# Patient Record
Sex: Male | Born: 1955 | Race: White | Hispanic: No | Marital: Married | Smoking: Former smoker
Health system: Southern US, Community
[De-identification: ages and names within clinical notes are randomized; demographics above are authoritative.]

## PROBLEM LIST (undated history)

## (undated) DIAGNOSIS — L409 Psoriasis, unspecified: Secondary | ICD-10-CM

## (undated) DIAGNOSIS — I10 Essential (primary) hypertension: Secondary | ICD-10-CM

## (undated) DIAGNOSIS — T7840XA Allergy, unspecified, initial encounter: Secondary | ICD-10-CM

## (undated) DIAGNOSIS — E669 Obesity, unspecified: Secondary | ICD-10-CM

## (undated) DIAGNOSIS — F419 Anxiety disorder, unspecified: Secondary | ICD-10-CM

## (undated) DIAGNOSIS — E079 Disorder of thyroid, unspecified: Secondary | ICD-10-CM

## (undated) DIAGNOSIS — C801 Malignant (primary) neoplasm, unspecified: Secondary | ICD-10-CM

## (undated) DIAGNOSIS — C61 Malignant neoplasm of prostate: Secondary | ICD-10-CM

## (undated) DIAGNOSIS — E785 Hyperlipidemia, unspecified: Secondary | ICD-10-CM

## (undated) DIAGNOSIS — K5790 Diverticulosis of intestine, part unspecified, without perforation or abscess without bleeding: Secondary | ICD-10-CM

## (undated) DIAGNOSIS — E039 Hypothyroidism, unspecified: Secondary | ICD-10-CM

## (undated) DIAGNOSIS — K5090 Crohn's disease, unspecified, without complications: Secondary | ICD-10-CM

## (undated) DIAGNOSIS — M1990 Unspecified osteoarthritis, unspecified site: Secondary | ICD-10-CM

## (undated) DIAGNOSIS — K219 Gastro-esophageal reflux disease without esophagitis: Secondary | ICD-10-CM

## (undated) DIAGNOSIS — D126 Benign neoplasm of colon, unspecified: Secondary | ICD-10-CM

## (undated) HISTORY — DX: Diverticulosis of intestine, part unspecified, without perforation or abscess without bleeding: K57.90

## (undated) HISTORY — DX: Hypothyroidism, unspecified: E03.9

## (undated) HISTORY — DX: Essential (primary) hypertension: I10

## (undated) HISTORY — PX: ANAL FISTULECTOMY: SHX1139

## (undated) HISTORY — DX: Anxiety disorder, unspecified: F41.9

## (undated) HISTORY — PX: POLYPECTOMY: SHX149

## (undated) HISTORY — DX: Malignant neoplasm of prostate: C61

## (undated) HISTORY — DX: Malignant (primary) neoplasm, unspecified: C80.1

## (undated) HISTORY — PX: WISDOM TOOTH EXTRACTION: SHX21

## (undated) HISTORY — DX: Hyperlipidemia, unspecified: E78.5

## (undated) HISTORY — DX: Disorder of thyroid, unspecified: E07.9

## (undated) HISTORY — DX: Obesity, unspecified: E66.9

## (undated) HISTORY — DX: Unspecified osteoarthritis, unspecified site: M19.90

## (undated) HISTORY — PX: COLONOSCOPY: SHX174

## (undated) HISTORY — PX: TONSILLECTOMY: SUR1361

## (undated) HISTORY — DX: Benign neoplasm of colon, unspecified: D12.6

## (undated) HISTORY — DX: Gastro-esophageal reflux disease without esophagitis: K21.9

## (undated) HISTORY — DX: Crohn's disease, unspecified, without complications: K50.90

## (undated) HISTORY — DX: Allergy, unspecified, initial encounter: T78.40XA

## (undated) HISTORY — DX: Psoriasis, unspecified: L40.9

---

## 2009-08-29 ENCOUNTER — Ambulatory Visit: Payer: Self-pay | Admitting: Gastroenterology

## 2009-09-09 ENCOUNTER — Telehealth: Payer: Self-pay | Admitting: Gastroenterology

## 2009-09-13 ENCOUNTER — Ambulatory Visit: Payer: Self-pay | Admitting: Gastroenterology

## 2009-09-24 ENCOUNTER — Encounter: Payer: Self-pay | Admitting: Gastroenterology

## 2012-10-26 HISTORY — PX: PROSTATE CRYOABLATION: SUR358

## 2014-02-26 ENCOUNTER — Encounter: Payer: Self-pay | Admitting: Sports Medicine

## 2014-02-26 ENCOUNTER — Ambulatory Visit (INDEPENDENT_AMBULATORY_CARE_PROVIDER_SITE_OTHER): Payer: 59 | Admitting: Sports Medicine

## 2014-02-26 VITALS — BP 142/93 | Ht 73.0 in | Wt 292.0 lb

## 2014-02-26 DIAGNOSIS — M7660 Achilles tendinitis, unspecified leg: Secondary | ICD-10-CM

## 2014-02-26 DIAGNOSIS — M79671 Pain in right foot: Secondary | ICD-10-CM

## 2014-02-26 DIAGNOSIS — M7661 Achilles tendinitis, right leg: Secondary | ICD-10-CM

## 2014-02-26 DIAGNOSIS — M79609 Pain in unspecified limb: Secondary | ICD-10-CM

## 2014-02-26 NOTE — Progress Notes (Signed)
   Subjective:    Patient ID: George Mathews, male    DOB: August 01, 1956, 58 y.o.   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 No injury that he can recall but gradual onset of pain and swelling that he notices mainly at the end of activity. He also describes some significant pain with standing and walking after prolonged sitting. He localizes all of his discomfort to the posterior heel. He has had a history of plantar fasciitis in the left foot previously but states his current pain is different in nature than what he experienced at that time. He denies any prior surgeries to his feet or ankles in the past. He has not had any specific treatment for this condition. He is referred by his primary care physician, Dr. Brigitte Pulse, for evaluation and treatment.  Past medical history and surgical history are reviewed. History is significant for recent surgery for prostate cancer. He also has a history of hypothyroidism, hypercholesterolemia, and hypertension. Medications reviewed. These include Crestor, Synthroid, and Viagra No known drug allergies Socially he does not smoke, drinks alcohol on occasion, and works as a Airline pilot    Review of Systems As above    Objective:   Physical Exam Well-developed, overweight. No acute distress. Awake alert and oriented x3. Vital signs reviewed.  Right heel: There is tenderness as well as induration at the insertion of the Achilles tendon onto the calcaneus. No significant soft tissue swelling. No thickening of the tendon proximal to the calcaneus. Fairly good passive dorsiflexion without pain. No tenderness to palpation at the calcaneal insertion of the plantar fascia. Neurovascularly intact distally. Walking with a very slight limp.  MSK ultrasound of the right Achilles was performed. Images in both long and short axis were obtained. He  has hypoechoic changes as well as calcification of the distal Achilles tendon at the insertion onto the calcaneus. There is thickening of the tendon at this level as well. 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 Eccentric heel drops exercises to be done daily. We discussed the possibility of topical nitroglycerin but the patient is currently taking Viagra each bedtime as part of his rehabilitation from his recent prostate surgery. This is a contraindication to nitroglycerin so we will not start that at this time. However, that is something we may reconsider down the road. I've asked him to start icing his heel at the end of activity. Patient will followup with me in 4 weeks. He understands that this is a long process and it may take several months for his symptoms to resolve.  Total time spent with the patient was 40 minutes with greater than 50% of that time spent in face-to-face consultation.

## 2014-03-26 ENCOUNTER — Ambulatory Visit: Payer: 59 | Admitting: Sports Medicine

## 2014-04-10 ENCOUNTER — Ambulatory Visit: Payer: 59 | Admitting: Sports Medicine

## 2014-07-04 ENCOUNTER — Encounter: Payer: Self-pay | Admitting: Gastroenterology

## 2015-01-28 ENCOUNTER — Encounter: Payer: Self-pay | Admitting: Gastroenterology

## 2015-05-30 ENCOUNTER — Other Ambulatory Visit: Payer: Self-pay | Admitting: Occupational Medicine

## 2015-05-30 ENCOUNTER — Ambulatory Visit
Admission: RE | Admit: 2015-05-30 | Discharge: 2015-05-30 | Disposition: A | Discharge status: Home / Self Care | Payer: No Typology Code available for payment source | Source: Ambulatory Visit | Attending: Occupational Medicine | Admitting: Occupational Medicine

## 2015-05-30 DIAGNOSIS — Z139 Encounter for screening, unspecified: Secondary | ICD-10-CM

## 2015-07-22 ENCOUNTER — Encounter: Payer: Self-pay | Admitting: Gastroenterology

## 2015-11-21 ENCOUNTER — Ambulatory Visit (AMBULATORY_SURGERY_CENTER): Payer: Self-pay | Admitting: *Deleted

## 2015-11-21 VITALS — Ht 74.0 in | Wt 313.0 lb

## 2015-11-21 DIAGNOSIS — Z86010 Personal history of colonic polyps: Secondary | ICD-10-CM

## 2015-11-21 DIAGNOSIS — K5000 Crohn's disease of small intestine without complications: Secondary | ICD-10-CM

## 2015-11-21 MED ORDER — NA SULFATE-K SULFATE-MG SULF 17.5-3.13-1.6 GM/177ML PO SOLN: 1.0000 | Freq: Once | ORAL | Status: DC

## 2015-11-21 NOTE — Progress Notes (Signed)
No egg or soy allergy known to patient  No issues with past sedation with any surgeries  or procedures, no intubation problems  No diet pills per patient No home 02 use per patient  No blood thinners per patient  emmi declined Pt and wife in [place] today. All instructions discussed with both, questions answered.

## 2015-12-02 ENCOUNTER — Encounter: Payer: Self-pay | Admitting: Gastroenterology

## 2015-12-02 ENCOUNTER — Ambulatory Visit (AMBULATORY_SURGERY_CENTER): Payer: Commercial Managed Care - PPO | Admitting: Gastroenterology

## 2015-12-02 VITALS — BP 120/52 | HR 66 | Temp 96.2°F | Resp 10 | Ht 74.0 in | Wt 313.0 lb

## 2015-12-02 DIAGNOSIS — D120 Benign neoplasm of cecum: Secondary | ICD-10-CM

## 2015-12-02 DIAGNOSIS — D124 Benign neoplasm of descending colon: Secondary | ICD-10-CM

## 2015-12-02 DIAGNOSIS — Z86010 Personal history of colonic polyps: Secondary | ICD-10-CM

## 2015-12-02 MED ORDER — SODIUM CHLORIDE 0.9 % IV SOLN: 500.0000 mL | INTRAVENOUS | Status: DC

## 2015-12-02 NOTE — Progress Notes (Signed)
Called to room to assist during endoscopic procedure.  Patient ID and intended procedure confirmed with present staff. Received instructions for my participation in the procedure from the performing physician.  

## 2015-12-02 NOTE — Progress Notes (Signed)
To recovery, report to [facility], [facility]

## 2015-12-02 NOTE — Patient Instructions (Signed)
YOU HAD AN ENDOSCOPIC PROCEDURE TODAY AT [facility]:   Refer to the procedure report that was given to you for any specific questions about what was found during the examination.  If the procedure report does not answer your questions, please call your gastroenterologist to clarify.  If you requested that your care partner not be given the details of your procedure findings, then the procedure report has been included in a sealed envelope for you to review at your convenience later.  YOU SHOULD EXPECT: Some feelings of bloating in the abdomen. Passage of more gas than usual.  Walking can help get rid of the air that was put into your GI tract during the procedure and reduce the bloating. If you had a lower endoscopy (such as a colonoscopy or flexible sigmoidoscopy) you may notice spotting of blood in your stool or on the toilet paper. If you underwent a bowel prep for your procedure, you may not have a normal bowel movement for a few days.  Please Note:  You might notice some irritation and congestion in your nose or some drainage.  This is from the oxygen used during your procedure.  There is no need for concern and it should clear up in a day or so.  SYMPTOMS TO REPORT IMMEDIATELY:   Following lower endoscopy (colonoscopy or flexible sigmoidoscopy):  Excessive amounts of blood in the stool  Significant tenderness or worsening of abdominal pains  Swelling of the abdomen that is new, acute  Fever of 100F or higher   For urgent or emergent issues, a gastroenterologist can be reached at any hour by calling [phone].   DIET: Your first meal following the procedure should be a small meal and then it is ok to progress to your normal diet. Heavy or fried foods are harder to digest and may make you feel nauseous or bloated.  Likewise, meals heavy in dairy and vegetables can increase bloating.  Drink plenty of fluids but you should avoid alcoholic beverages for 24  hours.  ACTIVITY:  You should plan to take it easy for the rest of today and you should NOT DRIVE or use heavy machinery until tomorrow (because of the sedation medicines used during the test).    FOLLOW UP: Our staff will call the number listed on your records the next business day following your procedure to check on you and address any questions or concerns that you may have regarding the information given to you following your procedure. If we do not reach you, we will leave a message.  However, if you are feeling well and you are not experiencing any problems, there is no need to return our call.  We will assume that you have returned to your regular daily activities without incident.  If any biopsies were taken you will be contacted by phone or by letter within the next 1-3 weeks.  Please call us at [phone] if you have not heard about the biopsies in 3 weeks.    SIGNATURES/CONFIDENTIALITY: You and/or your care partner have signed paperwork which will be entered into your electronic medical record.  These signatures attest to the fact that that the information above on your After Visit Summary has been reviewed and is understood.  Full responsibility of the confidentiality of this discharge information lies with you and/or your care-partner.  Polyps, diverticulosis, high fiber diet, hemorrhoids-handouts given  Repeat colonoscopy in 5 years 2022.

## 2015-12-02 NOTE — Op Note (Signed)
[place]  [address] [place], [id number]   COLONOSCOPY PROCEDURE REPORT PATIENT: Adair, Stotler  MR#: [id number] BIRTHDATE: 1956-10-23 , 59  yrs. old GENDER: male ENDOSCOPIST: Ladene Artist, MD, [facility] REFERRED [id number] Brigitte Pulse, M.D. PROCEDURE DATE:  12/02/2015 PROCEDURE:   Colonoscopy, surveillance , Colonoscopy with biopsy, and Colonoscopy with snare polypectomy First Screening Colonoscopy - Avg.  risk and is 50 yrs.  old or older - No.  Prior Negative Screening - Now for repeat screening. N/A  History of Adenoma - Now for follow-up colonoscopy & has been > or = to 3 yrs.  Yes hx of adenoma.  Has been 3 or more years since last colonoscopy.  Polyps removed today? Yes ASA CLASS:   Class II INDICATIONS:Surveillance due to prior colonic neoplasia and PH Colon Adenoma. MEDICATIONS: Monitored anesthesia care and Propofol 250 mg IV DESCRIPTION OF PROCEDURE:   After the risks benefits and alternatives of the procedure were thoroughly explained, informed consent was obtained.  The digital rectal exam revealed no abnormalities of the rectum.   The LB [id number]  endoscope was introduced through the anus and advanced to the cecum, which was identified by both the appendix and ileocecal valve. No adverse events experienced.   The quality of the prep was good.  (MiraLax was used)  The instrument was then slowly withdrawn as the colon was fully examined. Estimated blood loss is zero unless otherwise noted in this procedure report.  COLON FINDINGS: A sessile polyp measuring 5 mm in size was found at the cecum.  A polypectomy was performed with cold forceps.  The resection was complete, the polyp tissue was completely retrieved and sent to histology.   A sessile polyp measuring 6 mm in size was found in the descending colon.  A polypectomy was performed with a cold snare.  The resection was complete, the polyp tissue was completely retrieved and sent to  histology. There was moderate diverticulosis noted in the sigmoid colon and descending colon with associated colonic spasm, colonic narrowing and muscular hypertrophy. The examination was otherwise normal.  Retroflexed views revealed internal Grade I hemorrhoids. The time to cecum = 3.1 Withdrawal time = 11.0   The scope was withdrawn and the procedure completed. COMPLICATIONS: There were no immediate complications.  ENDOSCOPIC IMPRESSION: 1.   Sessile polyp at the cecum; polypectomy performed with cold forceps 2.   Sessile polyp in the descending colon; polypectomy performed with a cold snare 3.   Moderate diverticulosis in the sigmoid colon and descending colon 4.   Grade l internal hemorrhoids  RECOMMENDATIONS: 1.  Await pathology results 2.  High fiber diet with liberal fluid intake. 3.  Repeat Colonoscopy in 5 years.  eSigned:  Ladene Artist, MD, [facility] 12/02/2015 9:26 AM

## 2015-12-02 NOTE — Progress Notes (Signed)
No egg or soy allergy known to patient  No issues with past sedation with any surgeries  or procedures, no intubation problems  No diet pills per patient No home 02 use per patient  No blood thinners per patient    

## 2015-12-03 ENCOUNTER — Telehealth: Payer: Self-pay | Admitting: *Deleted

## 2015-12-03 NOTE — Telephone Encounter (Signed)
  Follow up Call-  Call back number 12/02/2015  Post procedure Call Back phone  # Pamala Hurry  Permission to leave phone message Yes     Patient questions:  Do you have a fever, pain , or abdominal swelling? No. Pain Score  0 *  Have you tolerated food without any problems? Yes.    Have you been able to return to your normal activities? Yes.    Do you have any questions about your discharge instructions: Diet   No. Medications  No. Follow up visit  No.  Do you have questions or concerns about your Care? No.  Actions: * If pain score is 4 or above: No action needed, pain <4.

## 2015-12-03 NOTE — Telephone Encounter (Deleted)
  Follow up Call-  Call back number 12/02/2015  Post procedure Call Back phone  # Pamala Hurry  Permission to leave phone message Yes     Patient questions:  Do you have a fever, pain , or abdominal swelling? {yes no:314532} Pain Score  {NUMBERS; 0-10:5044} *  Have you tolerated food without any problems? {yes no:314532}  Have you been able to return to your normal activities? {yes no:314532}  Do you have any questions about your discharge instructions: Diet   {yes no:314532} Medications  {yes no:314532} Follow up visit  {yes no:314532}  Do you have questions or concerns about your Care? {yes no:314532}  Actions: * If pain score is 4 or above: {ACTION; [facility] ENDO PAIN >4:21563::"No action needed, pain <4."}

## 2015-12-10 ENCOUNTER — Encounter: Payer: Self-pay | Admitting: Gastroenterology

## 2016-12-30 DIAGNOSIS — E039 Hypothyroidism, unspecified: Secondary | ICD-10-CM | POA: Diagnosis not present

## 2016-12-30 DIAGNOSIS — R7301 Impaired fasting glucose: Secondary | ICD-10-CM | POA: Diagnosis not present

## 2016-12-30 DIAGNOSIS — Z0000 Encounter for general adult medical examination without abnormal findings: Secondary | ICD-10-CM | POA: Diagnosis not present

## 2016-12-30 DIAGNOSIS — I10 Essential (primary) hypertension: Secondary | ICD-10-CM | POA: Diagnosis not present

## 2016-12-30 DIAGNOSIS — C61 Malignant neoplasm of prostate: Secondary | ICD-10-CM | POA: Diagnosis not present

## 2017-01-13 DIAGNOSIS — M25561 Pain in right knee: Secondary | ICD-10-CM | POA: Diagnosis not present

## 2017-01-13 DIAGNOSIS — M545 Low back pain: Secondary | ICD-10-CM | POA: Diagnosis not present

## 2017-01-13 DIAGNOSIS — M722 Plantar fascial fibromatosis: Secondary | ICD-10-CM | POA: Diagnosis not present

## 2017-01-15 DIAGNOSIS — M25561 Pain in right knee: Secondary | ICD-10-CM | POA: Diagnosis not present

## 2017-01-15 DIAGNOSIS — M722 Plantar fascial fibromatosis: Secondary | ICD-10-CM | POA: Diagnosis not present

## 2017-01-15 DIAGNOSIS — M545 Low back pain: Secondary | ICD-10-CM | POA: Diagnosis not present

## 2017-01-20 DIAGNOSIS — M545 Low back pain: Secondary | ICD-10-CM | POA: Diagnosis not present

## 2017-01-20 DIAGNOSIS — M722 Plantar fascial fibromatosis: Secondary | ICD-10-CM | POA: Diagnosis not present

## 2017-01-20 DIAGNOSIS — M25561 Pain in right knee: Secondary | ICD-10-CM | POA: Diagnosis not present

## 2017-01-26 DIAGNOSIS — M545 Low back pain: Secondary | ICD-10-CM | POA: Diagnosis not present

## 2017-01-26 DIAGNOSIS — M722 Plantar fascial fibromatosis: Secondary | ICD-10-CM | POA: Diagnosis not present

## 2017-01-26 DIAGNOSIS — M25561 Pain in right knee: Secondary | ICD-10-CM | POA: Diagnosis not present

## 2017-02-01 DIAGNOSIS — C61 Malignant neoplasm of prostate: Secondary | ICD-10-CM | POA: Diagnosis not present

## 2017-02-04 DIAGNOSIS — M545 Low back pain: Secondary | ICD-10-CM | POA: Diagnosis not present

## 2017-02-04 DIAGNOSIS — M25561 Pain in right knee: Secondary | ICD-10-CM | POA: Diagnosis not present

## 2017-02-04 DIAGNOSIS — M722 Plantar fascial fibromatosis: Secondary | ICD-10-CM | POA: Diagnosis not present

## 2017-02-09 DIAGNOSIS — M25561 Pain in right knee: Secondary | ICD-10-CM | POA: Diagnosis not present

## 2017-02-09 DIAGNOSIS — M545 Low back pain: Secondary | ICD-10-CM | POA: Diagnosis not present

## 2017-02-09 DIAGNOSIS — M722 Plantar fascial fibromatosis: Secondary | ICD-10-CM | POA: Diagnosis not present

## 2017-02-18 DIAGNOSIS — M545 Low back pain: Secondary | ICD-10-CM | POA: Diagnosis not present

## 2017-02-18 DIAGNOSIS — M25561 Pain in right knee: Secondary | ICD-10-CM | POA: Diagnosis not present

## 2017-02-18 DIAGNOSIS — M722 Plantar fascial fibromatosis: Secondary | ICD-10-CM | POA: Diagnosis not present

## 2017-02-24 DIAGNOSIS — M25561 Pain in right knee: Secondary | ICD-10-CM | POA: Diagnosis not present

## 2017-02-24 DIAGNOSIS — M545 Low back pain: Secondary | ICD-10-CM | POA: Diagnosis not present

## 2017-02-24 DIAGNOSIS — M722 Plantar fascial fibromatosis: Secondary | ICD-10-CM | POA: Diagnosis not present

## 2017-08-07 DIAGNOSIS — L255 Unspecified contact dermatitis due to plants, except food: Secondary | ICD-10-CM | POA: Diagnosis not present

## 2018-01-04 DIAGNOSIS — C61 Malignant neoplasm of prostate: Secondary | ICD-10-CM | POA: Diagnosis not present

## 2018-01-04 DIAGNOSIS — I10 Essential (primary) hypertension: Secondary | ICD-10-CM | POA: Diagnosis not present

## 2018-01-04 DIAGNOSIS — R7301 Impaired fasting glucose: Secondary | ICD-10-CM | POA: Diagnosis not present

## 2018-01-04 DIAGNOSIS — E039 Hypothyroidism, unspecified: Secondary | ICD-10-CM | POA: Diagnosis not present

## 2018-01-04 DIAGNOSIS — E7800 Pure hypercholesterolemia, unspecified: Secondary | ICD-10-CM | POA: Diagnosis not present

## 2018-01-04 DIAGNOSIS — Z0000 Encounter for general adult medical examination without abnormal findings: Secondary | ICD-10-CM | POA: Diagnosis not present

## 2018-04-08 DIAGNOSIS — R5383 Other fatigue: Secondary | ICD-10-CM | POA: Diagnosis not present

## 2018-04-12 DIAGNOSIS — E291 Testicular hypofunction: Secondary | ICD-10-CM | POA: Diagnosis not present

## 2018-05-10 DIAGNOSIS — R1032 Left lower quadrant pain: Secondary | ICD-10-CM | POA: Diagnosis not present

## 2018-05-10 DIAGNOSIS — A084 Viral intestinal infection, unspecified: Secondary | ICD-10-CM | POA: Diagnosis not present

## 2018-05-13 ENCOUNTER — Encounter (HOSPITAL_COMMUNITY): Payer: Self-pay

## 2018-05-13 ENCOUNTER — Emergency Department (HOSPITAL_COMMUNITY)
Admission: EM | Admit: 2018-05-13 | Discharge: 2018-05-13 | Disposition: A | Discharge status: Home / Self Care | Payer: Commercial Managed Care - PPO | Attending: Emergency Medicine | Admitting: Emergency Medicine

## 2018-05-13 ENCOUNTER — Emergency Department (HOSPITAL_COMMUNITY): Payer: Commercial Managed Care - PPO

## 2018-05-13 ENCOUNTER — Other Ambulatory Visit: Payer: Self-pay

## 2018-05-13 DIAGNOSIS — I10 Essential (primary) hypertension: Secondary | ICD-10-CM | POA: Diagnosis not present

## 2018-05-13 DIAGNOSIS — N200 Calculus of kidney: Secondary | ICD-10-CM | POA: Diagnosis not present

## 2018-05-13 DIAGNOSIS — Z87891 Personal history of nicotine dependence: Secondary | ICD-10-CM | POA: Insufficient documentation

## 2018-05-13 DIAGNOSIS — E079 Disorder of thyroid, unspecified: Secondary | ICD-10-CM | POA: Insufficient documentation

## 2018-05-13 DIAGNOSIS — Z79899 Other long term (current) drug therapy: Secondary | ICD-10-CM | POA: Diagnosis not present

## 2018-05-13 DIAGNOSIS — Z7982 Long term (current) use of aspirin: Secondary | ICD-10-CM | POA: Insufficient documentation

## 2018-05-13 DIAGNOSIS — R109 Unspecified abdominal pain: Secondary | ICD-10-CM | POA: Diagnosis not present

## 2018-05-13 LAB — CBC WITH DIFFERENTIAL/PLATELET
BASOS ABS: 0 10*3/uL (ref 0.0–0.1)
BASOS PCT: 0 %
EOS PCT: 2 %
Eosinophils Absolute: 0.2 10*3/uL (ref 0.0–0.7)
HEMATOCRIT: 41.7 % (ref 39.0–52.0)
Hemoglobin: 14.3 g/dL (ref 13.0–17.0)
LYMPHS PCT: 10 %
Lymphs Abs: 0.9 10*3/uL (ref 0.7–4.0)
MCH: 30.6 pg (ref 26.0–34.0)
MCHC: 34.3 g/dL (ref 30.0–36.0)
MCV: 89.3 fL (ref 78.0–100.0)
Monocytes Absolute: 0.8 10*3/uL (ref 0.1–1.0)
Monocytes Relative: 8 %
Neutro Abs: 7.8 10*3/uL — ABNORMAL HIGH (ref 1.7–7.7)
Neutrophils Relative %: 80 %
PLATELETS: 233 10*3/uL (ref 150–400)
RBC: 4.67 MIL/uL (ref 4.22–5.81)
RDW: 13.1 % (ref 11.5–15.5)
WBC: 9.8 10*3/uL (ref 4.0–10.5)

## 2018-05-13 LAB — URINALYSIS, ROUTINE W REFLEX MICROSCOPIC
Bilirubin Urine: NEGATIVE
Glucose, UA: NEGATIVE mg/dL
Ketones, ur: NEGATIVE mg/dL
Nitrite: NEGATIVE
Protein, ur: 30 mg/dL — AB
RBC / HPF: >50 RBC/hpf — ABNORMAL HIGH (ref 0–5)
SPECIFIC GRAVITY, URINE: 1.032 — AB (ref 1.005–1.030)
pH: 5 (ref 5.0–8.0)

## 2018-05-13 LAB — COMPREHENSIVE METABOLIC PANEL
ALBUMIN: 3.7 g/dL (ref 3.5–5.0)
ALT: 22 U/L (ref 0–44)
AST: 23 U/L (ref 15–41)
Alkaline Phosphatase: 61 U/L (ref 38–126)
Anion gap: 11 (ref 5–15)
BUN: 29 mg/dL — ABNORMAL HIGH (ref 6–20)
CHLORIDE: 107 mmol/L (ref 98–111)
CO2: 24 mmol/L (ref 22–32)
CREATININE: 1.85 mg/dL — AB (ref 0.61–1.24)
Calcium: 8.9 mg/dL (ref 8.9–10.3)
GFR calc Af Amer: 44 mL/min — ABNORMAL LOW (ref >60)
GFR, EST NON AFRICAN AMERICAN: 38 mL/min — AB (ref >60)
GLUCOSE: 128 mg/dL — AB (ref 70–99)
POTASSIUM: 3.6 mmol/L (ref 3.5–5.1)
Sodium: 142 mmol/L (ref 135–145)
TOTAL PROTEIN: 7.1 g/dL (ref 6.5–8.1)
Total Bilirubin: 0.8 mg/dL (ref 0.3–1.2)

## 2018-05-13 LAB — LIPASE, BLOOD: LIPASE: 26 U/L (ref 11–51)

## 2018-05-13 MED ORDER — HYDROMORPHONE HCL 1 MG/ML IJ SOLN
1.0000 mg | Freq: Once | INTRAMUSCULAR | Status: AC
  Administered 2018-05-13: 1 mg via INTRAVENOUS
  Filled 2018-05-13: qty 1

## 2018-05-13 MED ORDER — TAMSULOSIN HCL 0.4 MG PO CAPS: 0.4000 mg | ORAL_CAPSULE | Freq: Every day | ORAL | 0 refills | Status: DC

## 2018-05-13 MED ORDER — SODIUM CHLORIDE 0.9 % IV BOLUS
1000.0000 mL | Freq: Once | INTRAVENOUS | Status: AC
  Administered 2018-05-13: 1000 mL via INTRAVENOUS

## 2018-05-13 MED ORDER — IOPAMIDOL (ISOVUE-300) INJECTION 61%
80.0000 mL | Freq: Once | INTRAVENOUS | Status: AC | PRN
  Administered 2018-05-13: 80 mL via INTRAVENOUS

## 2018-05-13 MED ORDER — IOPAMIDOL (ISOVUE-300) INJECTION 61%
INTRAVENOUS | Status: AC
  Filled 2018-05-13: qty 100

## 2018-05-13 MED ORDER — OXYCODONE-ACETAMINOPHEN 5-325 MG PO TABS: 1.0000 | ORAL_TABLET | Freq: Four times a day (QID) | ORAL | 0 refills | Status: DC | PRN

## 2018-05-13 MED ORDER — ONDANSETRON HCL 4 MG/2ML IJ SOLN
4.0000 mg | Freq: Once | INTRAMUSCULAR | Status: AC
  Administered 2018-05-13: 4 mg via INTRAVENOUS
  Filled 2018-05-13: qty 2

## 2018-05-13 NOTE — Discharge Instructions (Signed)
Take Flomax daily as prescribed and maintain good fluid hydration by drinking plenty of water.  You may take Percocet as needed for recurrence of pain and pain management.  Follow-up with urology to ensure resolution of your kidney stone.  Return to the emergency department for worsening pain, uncontrolled vomiting, fever over 100.4 F, or other new or concerning symptoms.

## 2018-05-13 NOTE — ED Provider Notes (Signed)
[facility] DEPT Provider Note   CSN: [id number] Arrival date & time: 05/13/18  0024    History   Chief Complaint Chief Complaint  Patient presents with  . Abdominal Pain    HPI George Mathews is a 62 y.o. male.  62 year old male with history of dyslipidemia, hypertension, diverticulosis presents to the emergency department for evaluation of abdominal pain.  He states that pain has been ongoing for the last 2 to 3 days.  It was mild to moderate initially, but significantly worsened tonight.  He was initially seen at urgent care and had blood work and x-rays completed.  This was reassuring.  He was told to take Imodium and follow-up with his doctor.  You saw his primary care doctor the following day who started the patient on ciprofloxacin and Flagyl.  He has been taking these medications as prescribed.  Patient reports nausea with worsening pain tonight.  He had a small bowel movement yesterday, but has continued to pass gas.  No associated fevers or sick contacts.  Surgical history significant for anal fistulectomy.  The history is provided by the patient. No language interpreter was used.  Abdominal Pain      Past Medical History:  Diagnosis Date  . Allergy   . Cancer [facility])    prostate  . Crohn's disease ([place])   . Diverticulosis   . Hyperlipidemia   . Hypertension   . Thyroid disease     There are no active problems to display for this patient.   Past Surgical History:  Procedure Laterality Date  . ANAL FISTULECTOMY    . COLONOSCOPY    . POLYPECTOMY    . PROSTATE CRYOABLATION  2014  . TONSILLECTOMY     in 20's  . WISDOM TOOTH EXTRACTION          Home Medications    Prior to Admission medications   Medication Sig Start Date End Date Taking? Authorizing Provider  aspirin EC 81 MG tablet Take 81 mg by mouth daily.     [provider]  atorvastatin (LIPITOR) 10 MG tablet Take 10 mg by mouth daily.    [provider]  cetirizine (ZYRTEC) 10 MG tablet Take 10 mg by mouth daily.     [provider]  Coconut Oil 1000 MG CAPS Take 2 capsules by mouth daily.    [provider]  Fish Oil-Cholecalciferol (OMEGA-3 FISH OIL/VITAMIN D3) 1000-1000 MG-UNIT CAPS Take 1 capsule by mouth daily.     [provider]  levothyroxine (SYNTHROID, LEVOTHROID) 125 MCG tablet Take 120 mcg by mouth daily before breakfast.  12/27/13   [provider]  lisinopril (PRINIVIL,ZESTRIL) 10 MG tablet Take 10 mg by mouth daily.     [provider]  Misc Natural Products (PROSTATE HEALTH PO) Take 2 tablets by mouth daily.     [provider]  mometasone (ELOCON) 0.1 % cream Apply 1 application topically daily. Reported on 11/21/2015 02/09/14   [provider]  oxyCODONE-acetaminophen (PERCOCET/ROXICET) 5-325 MG tablet Take 1-2 tablets by mouth every 6 (six) hours as needed for severe pain. 05/13/18   Antonietta Breach, PA-C  tamsulosin (FLOMAX) 0.4 MG CAPS capsule Take 1 capsule (0.4 mg total) by mouth daily. 05/13/18   Antonietta Breach, PA-C    Family History Family History  Problem Relation Age of Onset  . Colon polyps Mother   . Colon cancer Neg Hx   . Esophageal cancer Neg Hx   . Rectal cancer  Neg Hx   . Stomach cancer Neg Hx     Social History Social History   Tobacco Use  . Smoking status: Former Research scientist (life sciences)  . Smokeless tobacco: Never Used  Substance Use Topics  . Alcohol use: Yes    Alcohol/week: 0.0 oz    Comment: occasionally  . Drug use: No     Allergies   Patient has no known allergies.   Review of Systems Review of Systems  Gastrointestinal: Positive for abdominal pain.  Ten systems reviewed and are negative for acute change, except as noted in the HPI.    Physical Exam Updated Vital Signs BP (!) 153/112 (BP Location: Right Arm)   Pulse 86   Temp 98.1 F (36.7 C) (Oral)   Ht 6\' 1"  (1.854 m)   Wt 136.1 kg (300 lb)   SpO2 97%   BMI 39.58  kg/m   Physical Exam  Constitutional: He is oriented to person, place, and time. He appears well-developed and well-nourished. No distress.  Nontoxic appearing, but uncomfortable  HENT:  Head: Normocephalic and atraumatic.  Eyes: Conjunctivae and EOM are normal. No scleral icterus.  Neck: Normal range of motion.  Cardiovascular: Normal rate, regular rhythm and intact distal pulses.  Pulmonary/Chest: Effort normal. No stridor. No respiratory distress.  Respirations even and unlabored  Abdominal: Soft. He exhibits no mass. There is tenderness (L mid abdomen, LLQ). There is guarding (mild, voluntary). There is no rebound.  Musculoskeletal: Normal range of motion.  Neurological: He is alert and oriented to person, place, and time. He exhibits normal muscle tone. Coordination normal.  Skin: Skin is warm and dry. No rash noted. He is not diaphoretic. No erythema. No pallor.  Psychiatric: He has a normal mood and affect. His behavior is normal.  Nursing note and vitals reviewed.    ED Treatments / Results  Labs (all labs ordered are listed, but only abnormal results are displayed) Labs Reviewed  CBC WITH DIFFERENTIAL/PLATELET - Abnormal; Notable for the following components:      Result Value   Neutro Abs 7.8 (*)    All other components within normal limits  COMPREHENSIVE METABOLIC PANEL - Abnormal; Notable for the following components:   Glucose, Bld 128 (*)    BUN 29 (*)    Creatinine, Ser 1.85 (*)    GFR calc non Af Amer 38 (*)    GFR calc Af Amer 44 (*)    All other components within normal limits  URINALYSIS, ROUTINE W REFLEX MICROSCOPIC - Abnormal; Notable for the following components:   Specific Gravity, Urine 1.032 (*)    Hgb urine dipstick MODERATE (*)    Protein, ur 30 (*)    Leukocytes, UA TRACE (*)    RBC / HPF >50 (*)    Bacteria, UA RARE (*)    All other components within normal limits  LIPASE, BLOOD    EKG None  Radiology Ct Abdomen Pelvis W  Contrast  Result Date: 05/13/2018 CLINICAL DATA:  Abdominal pain since Tuesday. EXAM: CT ABDOMEN AND PELVIS WITH CONTRAST TECHNIQUE: Multidetector CT imaging of the abdomen and pelvis was performed using the standard protocol following bolus administration of intravenous contrast. CONTRAST:  4mL ISOVUE-300 IOPAMIDOL (ISOVUE-300) INJECTION 61% COMPARISON:  Plain radiographs from 05/10/2018 FINDINGS: Lower chest: Heart is normal in size. No pericardial effusion. There is atelectasis at each lung base. No pulmonary consolidation nor effusion. Hepatobiliary: Hepatic steatosis. No biliary dilatation or mass. 5 mm nonobstructing gallstone noted along the dependent wall of the gallbladder.  Pancreas: Normal Spleen: Normal Adrenals/Urinary Tract: Normal bilateral adrenal glands. Perinephric fat stranding with moderate left-sided hydroureteronephrosis secondary to a 5 x 5 x 2 mm calculus in the left hemipelvis approximately 3 cm from the UVJ. There is some tracking of fluid along the left ureter likely related to ruptured calyceal fornix. Small cysts of both kidneys are noted too small to further characterize. The urinary bladder is unremarkable. Stomach/Bowel: Colonic diverticulosis along the descending sigmoid colon without acute diverticulitis. The distal and terminal ileum are normal. No evidence of acute appendicitis. No bowel obstruction. Decompressed stomach. Vascular/Lymphatic: Aortoiliac atherosclerosis.  No lymphadenopathy. Reproductive: Top-normal size prostate. Other: No free air nor free fluid. Small periumbilical fat containing hernia. Musculoskeletal: No acute or significant osseous findings. IMPRESSION: 1. There is a distal left ureteral calculus measuring 5 x 5 x 2 mm causing mild to moderate left-sided hydroureteronephrosis and perinephric fat stranding. 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 2. Uncomplicated cholelithiasis. 3. Hepatic steatosis. 4. Colonic  diverticulosis without acute diverticulitis. Electronically Signed   By: Ashley Royalty M.D.   On: 05/13/2018 03:28    Procedures Procedures (including critical care time)  Medications Ordered in ED Medications  iopamidol (ISOVUE-300) 61 % injection (has no administration in time range)  sodium chloride 0.9 % bolus 1,000 mL (0 mLs Intravenous Stopped 05/13/18 0226)  HYDROmorphone (DILAUDID) injection 1 mg (1 mg Intravenous Given 05/13/18 0137)  ondansetron (ZOFRAN) injection 4 mg (4 mg Intravenous Given 05/13/18 0137)  iopamidol (ISOVUE-300) 61 % injection 80 mL (80 mLs Intravenous Contrast Given 05/13/18 0253)    4:00 AM Case discussed with Dr. Clydene Laming of urology regarding CT results.  Dr. Clydene Laming reports no change in management; continue Flomax and narcotics as needed for pain control.  Plan for referral to [facility] for follow-up.  Patient advised to increase hydration.   Initial Impression / Assessment and Plan / ED Course  I have reviewed the triage vital signs and the nursing notes.  Pertinent labs & imaging results that were available during my care of the patient were reviewed by me and considered in my medical decision making (see chart for details).     Pt has been diagnosed with a kidney stone via CT. There is no evidence of significant hydronephrosis or UTI, vitals sign stable and the pt does not have irratractable vomiting. Pain has resolved following IV Dilaudid. Patient will be discharged home with pain medications and has been advised to follow up with Urology. Return precautions discussed and provided. Patient discharged in stable condition with no unaddressed concerns.   Final Clinical Impressions(s) / ED Diagnoses   Final diagnoses:  Kidney stone on left side    ED Discharge Orders        Ordered    tamsulosin (FLOMAX) 0.4 MG CAPS capsule  Daily     05/13/18 0403    oxyCODONE-acetaminophen (PERCOCET/ROXICET) 5-325 MG tablet  Every 6 hours PRN     05/13/18 0403        Antonietta Breach, PA-C 05/13/18 0422    Palumbo, April, MD 05/13/18 2035

## 2018-05-13 NOTE — ED Triage Notes (Signed)
Pt presents to ED from home for ABD pain. Pt reports that the pain has been going on since Tuesday. PT went to urgent care and had blood work and x-rays done. Pt was told everything was ok and sent home. Pt's PCP prescribed 2 antibiotics. Pt reports that the pain got worse tonight.

## 2018-06-01 ENCOUNTER — Other Ambulatory Visit: Payer: Self-pay | Admitting: Urology

## 2018-06-01 DIAGNOSIS — E291 Testicular hypofunction: Secondary | ICD-10-CM | POA: Diagnosis not present

## 2018-06-01 DIAGNOSIS — Z8546 Personal history of malignant neoplasm of prostate: Secondary | ICD-10-CM

## 2018-06-14 ENCOUNTER — Inpatient Hospital Stay
Admission: RE | Admit: 2018-06-14 | Discharge: 2018-06-14 | Disposition: A | Discharge status: Home / Self Care | Payer: Commercial Managed Care - PPO | Source: Ambulatory Visit | Attending: Urology | Admitting: Urology

## 2018-06-23 ENCOUNTER — Ambulatory Visit
Admission: RE | Admit: 2018-06-23 | Discharge: 2018-06-23 | Disposition: A | Discharge status: Home / Self Care | Payer: Commercial Managed Care - PPO | Source: Ambulatory Visit | Attending: Urology | Admitting: Urology

## 2018-06-23 DIAGNOSIS — K5730 Diverticulosis of large intestine without perforation or abscess without bleeding: Secondary | ICD-10-CM | POA: Diagnosis not present

## 2018-06-23 DIAGNOSIS — Z8546 Personal history of malignant neoplasm of prostate: Secondary | ICD-10-CM

## 2018-06-23 MED ORDER — GADOBENATE DIMEGLUMINE 529 MG/ML IV SOLN
10.0000 mL | Freq: Once | INTRAVENOUS | Status: AC | PRN
  Administered 2018-06-23: 10 mL via INTRAVENOUS

## 2018-07-19 DIAGNOSIS — R7301 Impaired fasting glucose: Secondary | ICD-10-CM | POA: Diagnosis not present

## 2018-07-19 DIAGNOSIS — I10 Essential (primary) hypertension: Secondary | ICD-10-CM | POA: Diagnosis not present

## 2018-07-19 DIAGNOSIS — E039 Hypothyroidism, unspecified: Secondary | ICD-10-CM | POA: Diagnosis not present

## 2018-07-19 DIAGNOSIS — E7800 Pure hypercholesterolemia, unspecified: Secondary | ICD-10-CM | POA: Diagnosis not present

## 2018-08-09 DIAGNOSIS — Z8546 Personal history of malignant neoplasm of prostate: Secondary | ICD-10-CM | POA: Diagnosis not present

## 2018-09-07 DIAGNOSIS — Z8546 Personal history of malignant neoplasm of prostate: Secondary | ICD-10-CM | POA: Diagnosis not present

## 2018-09-07 DIAGNOSIS — E291 Testicular hypofunction: Secondary | ICD-10-CM | POA: Diagnosis not present

## 2018-09-07 DIAGNOSIS — N200 Calculus of kidney: Secondary | ICD-10-CM | POA: Diagnosis not present

## 2018-09-16 DIAGNOSIS — N200 Calculus of kidney: Secondary | ICD-10-CM | POA: Diagnosis not present

## 2018-10-18 DIAGNOSIS — E291 Testicular hypofunction: Secondary | ICD-10-CM | POA: Diagnosis not present

## 2018-11-10 ENCOUNTER — Ambulatory Visit
Admission: RE | Admit: 2018-11-10 | Discharge: 2018-11-10 | Disposition: A | Discharge status: Home / Self Care | Payer: Commercial Managed Care - PPO | Source: Ambulatory Visit | Attending: Physician Assistant | Admitting: Physician Assistant

## 2018-11-10 ENCOUNTER — Other Ambulatory Visit: Payer: Self-pay | Admitting: Physician Assistant

## 2018-11-10 DIAGNOSIS — M25519 Pain in unspecified shoulder: Secondary | ICD-10-CM

## 2018-11-10 DIAGNOSIS — M19011 Primary osteoarthritis, right shoulder: Secondary | ICD-10-CM | POA: Diagnosis not present

## 2018-11-10 DIAGNOSIS — M25511 Pain in right shoulder: Secondary | ICD-10-CM | POA: Diagnosis not present

## 2018-11-23 DIAGNOSIS — M25511 Pain in right shoulder: Secondary | ICD-10-CM | POA: Diagnosis not present

## 2018-12-07 DIAGNOSIS — M25511 Pain in right shoulder: Secondary | ICD-10-CM | POA: Diagnosis not present

## 2018-12-31 DIAGNOSIS — Z23 Encounter for immunization: Secondary | ICD-10-CM | POA: Diagnosis not present

## 2019-03-08 DIAGNOSIS — M25511 Pain in right shoulder: Secondary | ICD-10-CM | POA: Diagnosis not present

## 2019-03-08 DIAGNOSIS — M7542 Impingement syndrome of left shoulder: Secondary | ICD-10-CM | POA: Diagnosis not present

## 2019-03-08 DIAGNOSIS — M25512 Pain in left shoulder: Secondary | ICD-10-CM | POA: Diagnosis not present

## 2019-03-08 DIAGNOSIS — E291 Testicular hypofunction: Secondary | ICD-10-CM | POA: Diagnosis not present

## 2019-03-08 DIAGNOSIS — Z8546 Personal history of malignant neoplasm of prostate: Secondary | ICD-10-CM | POA: Diagnosis not present

## 2019-03-08 DIAGNOSIS — M7501 Adhesive capsulitis of right shoulder: Secondary | ICD-10-CM | POA: Diagnosis not present

## 2019-09-30 IMAGING — DX DG SHOULDER 2+V*R*
3 series · 3 of 3 positions shown · non-contrast
Comparison: Chest x-ray 05/30/2015.

CLINICAL DATA: Right shoulder

EXAM:
RIGHT SHOULDER - 2+ VIEW

[dg shoulder right (1 of 3)]
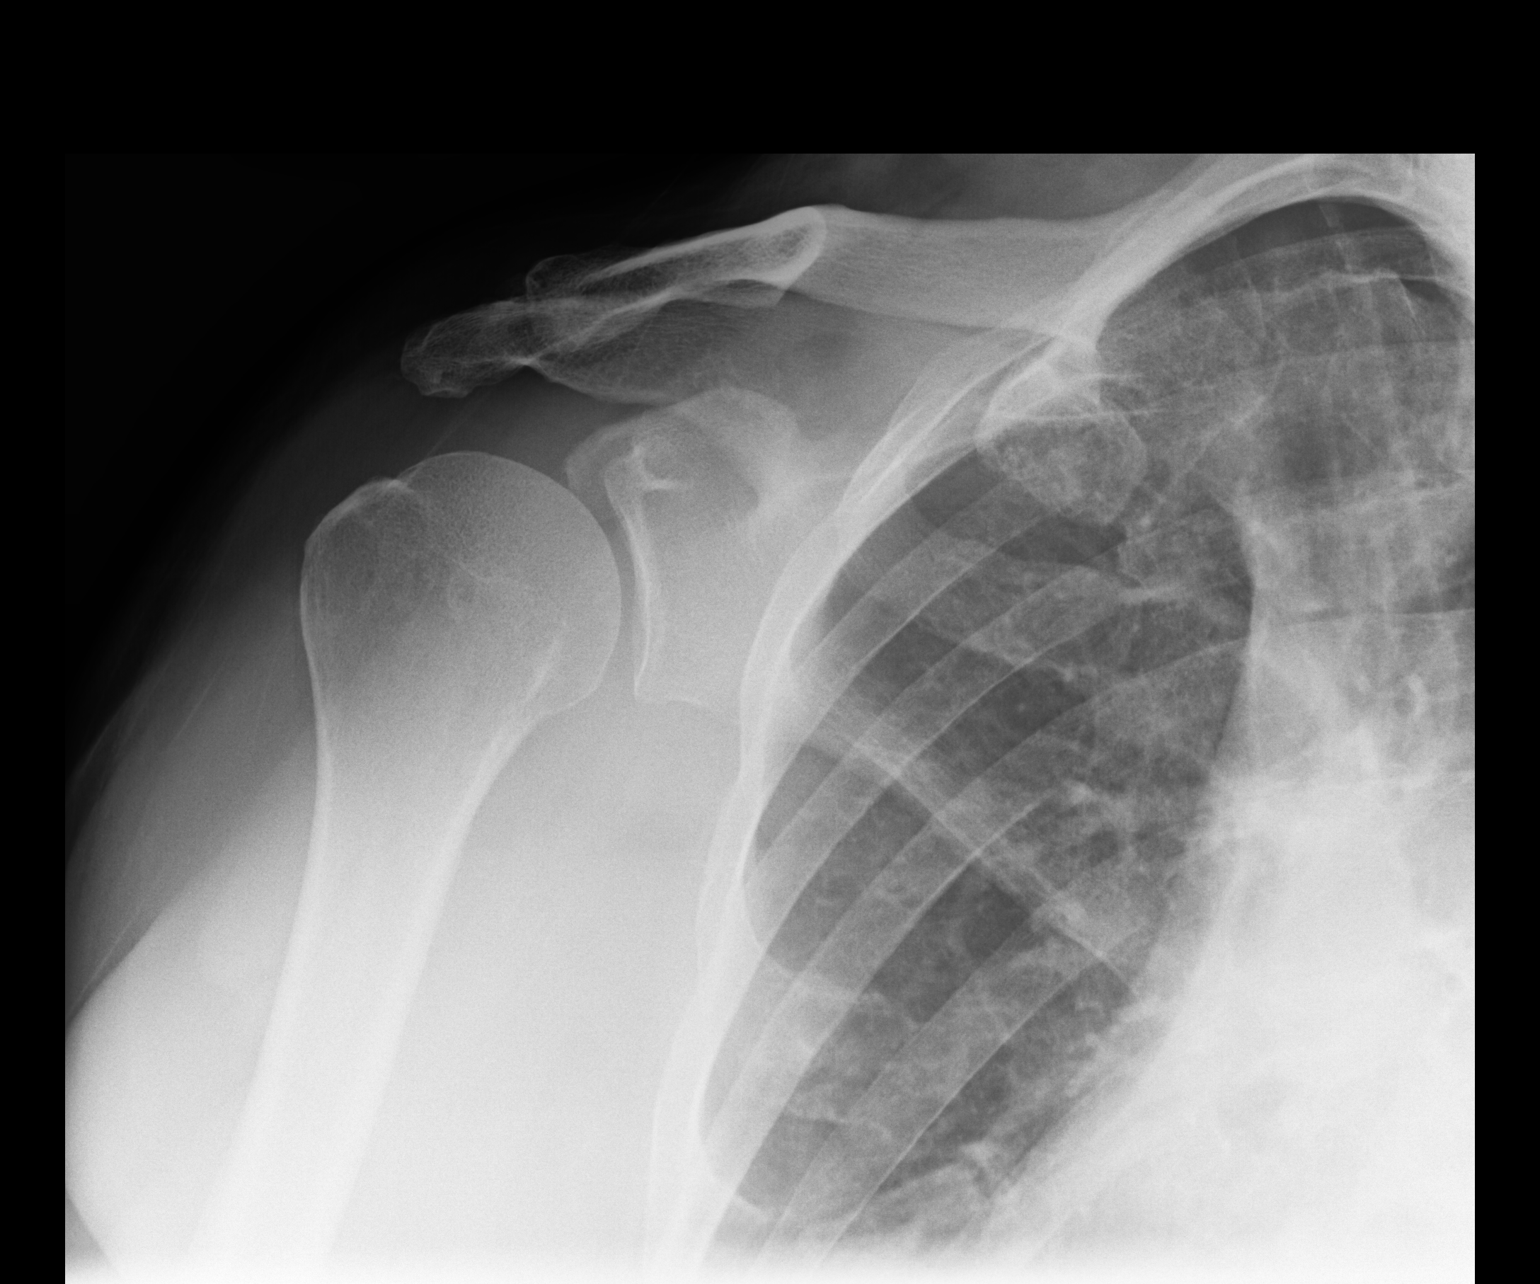

[dg shoulder right (2 of 3)]
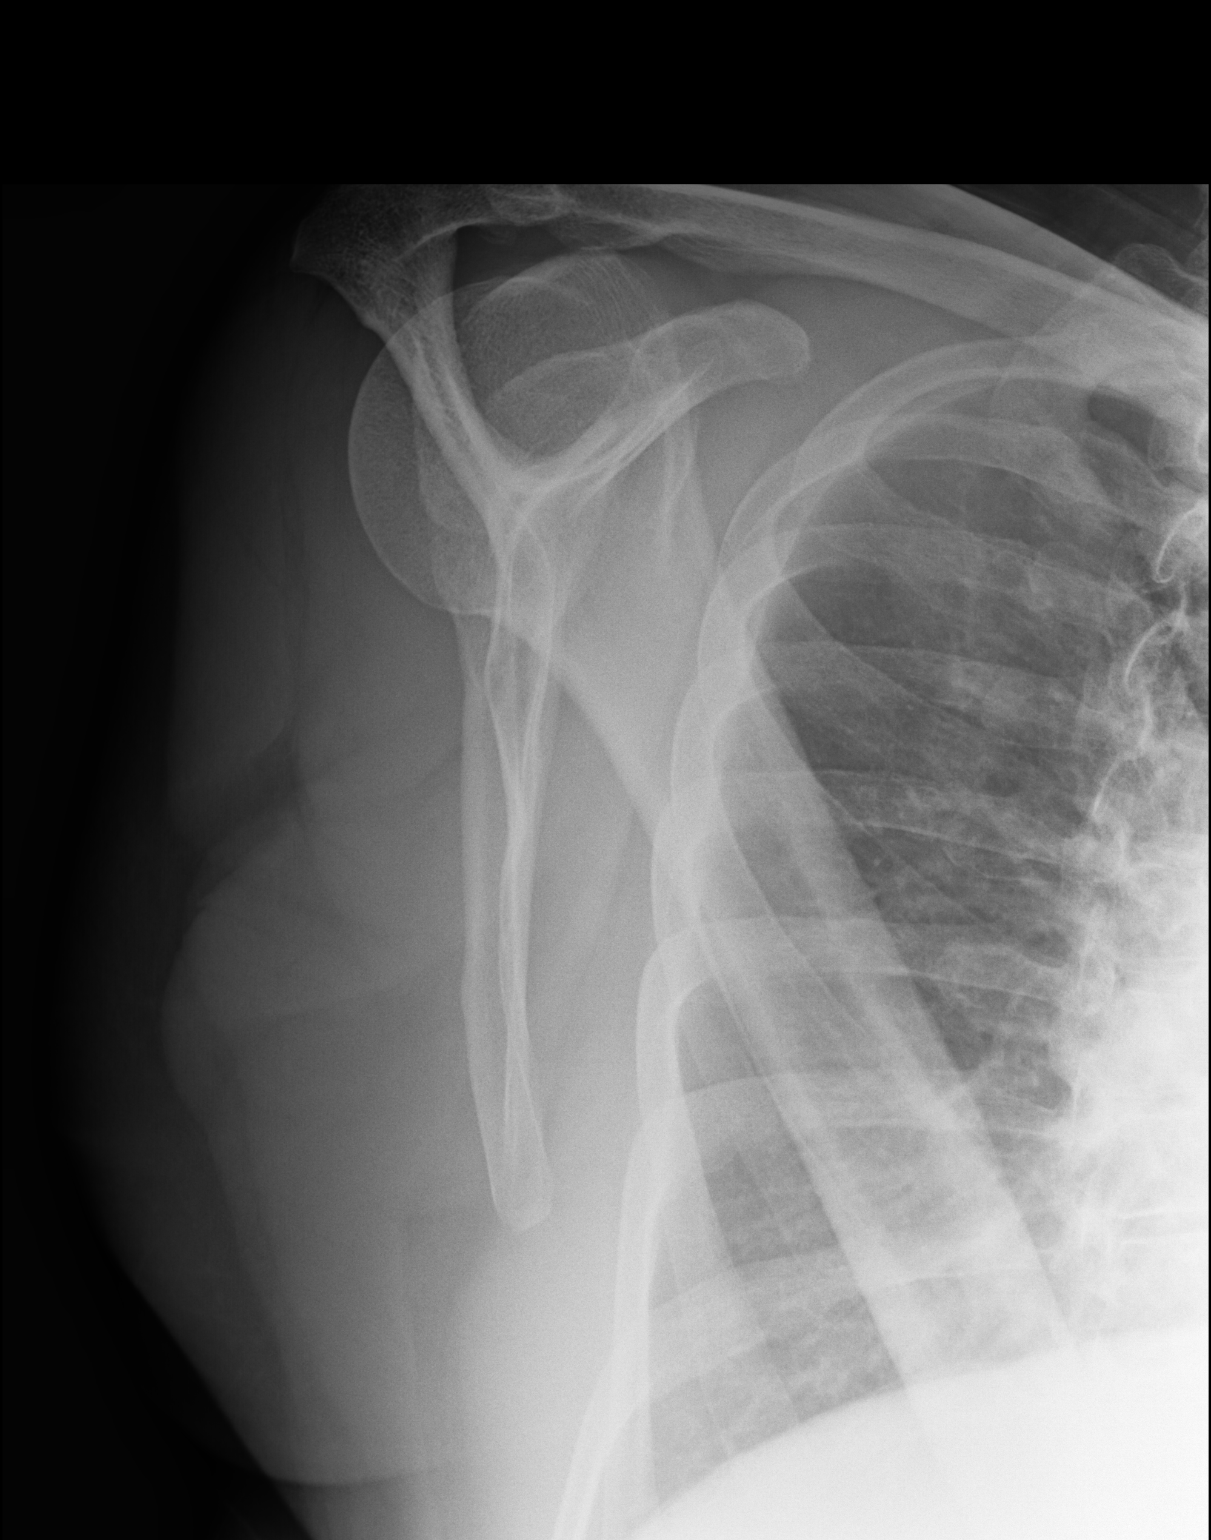

[dg shoulder right (3 of 3)]
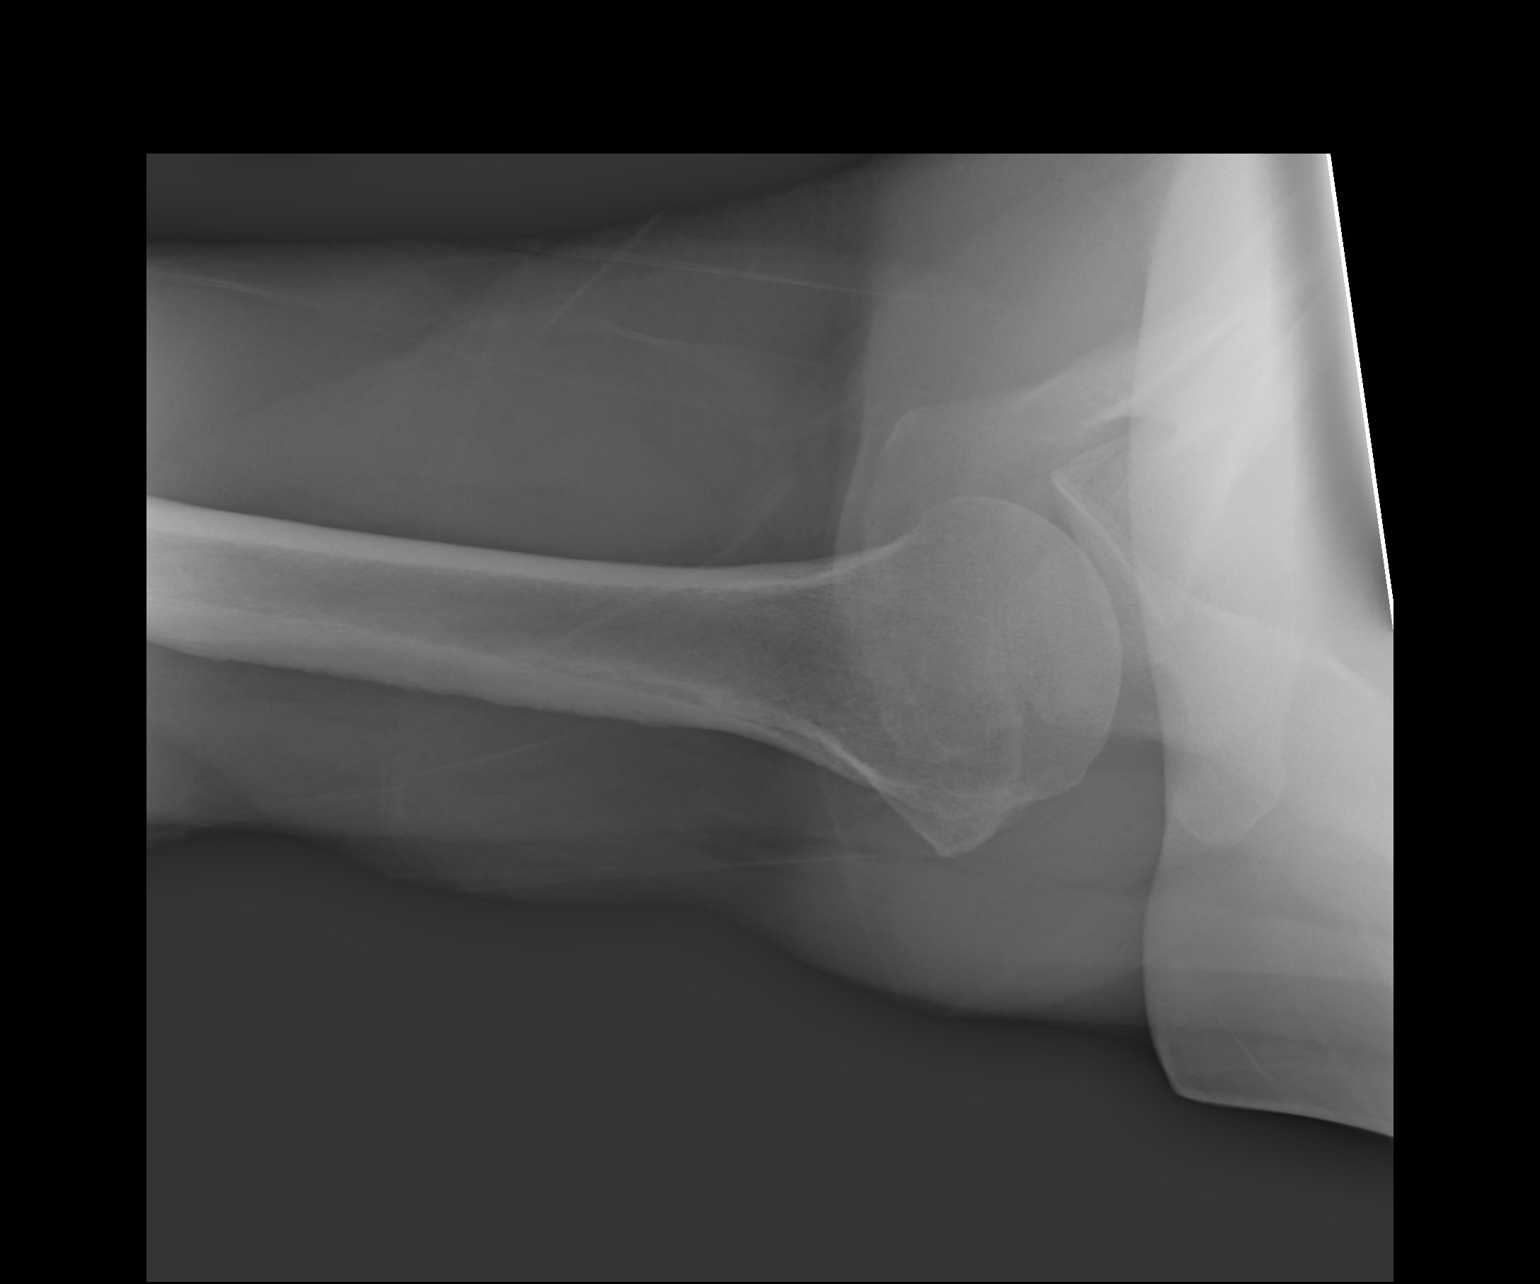

[3 of 3 positions shown; findings below may reference images not displayed]

FINDINGS: Acromioclavicular and glenohumeral degenerative change. No evidence
of fracture or dislocation. No evidence separation.
IMPRESSION: Acromioclavicular glenohumeral degenerative change. No acute
abnormality.

## 2021-05-08 ENCOUNTER — Encounter: Payer: Self-pay | Admitting: Gastroenterology

## 2021-05-15 ENCOUNTER — Encounter: Payer: Self-pay | Admitting: Gastroenterology

## 2021-06-24 ENCOUNTER — Ambulatory Visit: Payer: 59 | Admitting: Gastroenterology

## 2021-06-24 ENCOUNTER — Encounter: Payer: Self-pay | Admitting: Gastroenterology

## 2021-06-24 VITALS — BP 118/72 | HR 77 | Ht 74.0 in | Wt 319.4 lb

## 2021-06-24 DIAGNOSIS — Z860101 Personal history of adenomatous and serrated colon polyps: Secondary | ICD-10-CM

## 2021-06-24 DIAGNOSIS — Z86010 Personal history of colonic polyps: Secondary | ICD-10-CM

## 2021-06-24 DIAGNOSIS — R1319 Other dysphagia: Secondary | ICD-10-CM

## 2021-06-24 MED ORDER — NA SULFATE-K SULFATE-MG SULF 17.5-3.13-1.6 GM/177ML PO SOLN: 1.0000 | Freq: Once | ORAL | 0 refills | Status: AC

## 2021-06-24 NOTE — Progress Notes (Signed)
History of Present Illness: This is a 65 year old male referred by George Neer, MD for the evaluation of dysphagia, personal history of adenomatous colon polyps.  He relates intermittent difficulties with solid food dysphagia for several months with occasional heartburn and regurgitation.  His symptoms have not worsened over the past few weeks. TUMS has provided temporary relief reflux symptoms. Denies weight loss, abdominal pain, constipation, diarrhea, change in stool caliber, melena, hematochezia, nausea, vomiting, chest pain.   No Known Allergies Outpatient Medications Prior to Visit  Medication Sig Dispense Refill   amLODipine (NORVASC) 5 MG tablet Take 5 mg by mouth daily.     aspirin EC 81 MG tablet Take 81 mg by mouth daily.      atorvastatin (LIPITOR) 10 MG tablet Take 10 mg by mouth daily.     buPROPion (WELLBUTRIN XL) 300 MG 24 hr tablet Take 300 mg by mouth daily.     cetirizine (ZYRTEC) 10 MG tablet Take 10 mg by mouth daily.      Fish Oil-Cholecalciferol (OMEGA-3 FISH OIL/VITAMIN D3) 1000-1000 MG-UNIT CAPS Take 1 capsule by mouth daily.      levothyroxine (SYNTHROID, LEVOTHROID) 125 MCG tablet Take 120 mcg by mouth daily before breakfast.      lisinopril (PRINIVIL,ZESTRIL) 10 MG tablet Take 10 mg by mouth daily.      Misc Natural Products (PROSTATE HEALTH PO) Take 2 tablets by mouth daily.      mometasone (ELOCON) 0.1 % cream Apply 1 application topically daily. Reported on 11/21/2015     Coconut Oil 1000 MG CAPS Take 2 capsules by mouth daily.     oxyCODONE-acetaminophen (PERCOCET/ROXICET) 5-325 MG tablet Take 1-2 tablets by mouth every 6 (six) hours as needed for severe pain. 12 tablet 0   tamsulosin (FLOMAX) 0.4 MG CAPS capsule Take 1 capsule (0.4 mg total) by mouth daily. 10 capsule 0   No facility-administered medications prior to visit.   Past Medical History:  Diagnosis Date   Allergy    Anxiety    Cancer ([place])    prostate   Crohn's disease ([place])     Diverticulosis    Hyperlipidemia    Hypertension    Hypothyroidism    Obesity    Osteoarthritis    Prostate cancer ([place])    Psoriasis of scalp    Tubular adenoma of colon    2010   Past Surgical History:  Procedure Laterality Date   ANAL FISTULECTOMY     COLONOSCOPY     POLYPECTOMY     PROSTATE CRYOABLATION  2014   TONSILLECTOMY     in 20's   WISDOM TOOTH EXTRACTION     Social History   Socioeconomic History   Marital status: Married    Spouse name: Not on file   Number of children: Not on file   Years of education: Not on file   Highest education level: Not on file  Occupational History   Not on file  Tobacco Use   Smoking status: Former   Smokeless tobacco: Never  Substance and Sexual Activity   Alcohol use: Yes    Alcohol/week: 0.0 standard drinks    Comment: occasionally   Drug use: No   Sexual activity: Not on file  Other Topics Concern   Not on file  Social History Narrative   Not on file   Social Determinants of Health   Financial Resource Strain: Not on file  Food Insecurity: Not on file  Transportation Needs: Not on file  Physical Activity: Not on file  Stress: Not on file  Social Connections: Not on file   Family History  Problem Relation Age of Onset   Colon polyps Mother    Colon cancer Neg Hx    Esophageal cancer Neg Hx    Rectal cancer Neg Hx    Stomach cancer Neg Hx        Review of Systems: Pertinent positive and negative review of systems were noted in the above HPI section. All other review of systems were otherwise negative.    Physical Exam: General: Well developed, well nourished, no acute distress Head: Normocephalic and atraumatic Eyes: Sclerae anicteric, EOMI Ears: Normal auditory acuity Mouth: Not examined, mask on during Covid-19 pandemic Neck: Supple, no masses or thyromegaly Lungs: Clear throughout to auscultation Heart: Regular rate and rhythm; no murmurs, rubs or bruits Abdomen: Soft, non tender and non  distended. No masses, hepatosplenomegaly or hernias noted. Normal Bowel sounds Rectal: Deferred to colonoscopy Musculoskeletal: Symmetrical with no gross deformities  Skin: No lesions on visible extremities Pulses:  Normal pulses noted Extremities: No clubbing, cyanosis, edema or deformities noted Neurological: Alert oriented x 4, grossly nonfocal Cervical Nodes:  No significant cervical adenopathy Inguinal Nodes: No significant inguinal adenopathy Psychological:  Alert and cooperative. Normal mood and affect   Assessment and Recommendations:  Personal history adenomatous colon polyps.  Schedule surveillance colonoscopy. The risks (including bleeding, perforation, infection, missed lesions, medication reactions and possible hospitalization or surgery if complications occur), benefits, and alternatives to colonoscopy with possible biopsy and possible polypectomy were discussed with the patient and they consent to proceed.   Dysphagia.  GERD. Rule out esophagitis, esophageal stricture esophageal motility disorder.  Follow antireflux measures and continue TUMS as needed.  Schedule EGD with possible dilation. The risks (including bleeding, perforation, infection, missed lesions, medication reactions and possible hospitalization or surgery if complications occur), benefits, and alternatives to endoscopy with possible biopsy and possible dilation were discussed with the patient and they consent to proceed.     cc: George Neer, MD [address] [place],  [place] [id number]

## 2021-06-24 NOTE — Patient Instructions (Signed)
You have been scheduled for an endoscopy and colonoscopy. Please follow the written instructions given to you at your visit today. Please pick up your prep supplies at the pharmacy within the next 1-3 days. If you use inhalers (even only as needed), please bring them with you on the day of your procedure.  Due to recent changes in healthcare laws, you may see the results of your imaging and laboratory studies on MyChart before your provider has had a chance to review them.  We understand that in some cases there may be results that are confusing or concerning to you. Not all laboratory results come back in the same time frame and the provider may be waiting for multiple results in order to interpret others.  Please give us 48 hours in order for your provider to thoroughly review all the results before contacting the office for clarification of your results.   The [facility] providers would like to encourage you to use MYCHART to communicate with providers for non-urgent requests or questions.  Due to long hold times on the telephone, sending your provider a message by MYCHART may be a faster and more efficient way to get a response.  Please allow 48 business hours for a response.  Please remember that this is for non-urgent requests.   Thank you for choosing me and [facility].  Malcolm T. Stark, Jr., MD., FACG  

## 2021-08-04 ENCOUNTER — Encounter: Payer: Self-pay | Admitting: Gastroenterology

## 2021-08-11 ENCOUNTER — Ambulatory Visit (AMBULATORY_SURGERY_CENTER): Payer: 59 | Admitting: Gastroenterology

## 2021-08-11 ENCOUNTER — Encounter: Payer: Self-pay | Admitting: Gastroenterology

## 2021-08-11 ENCOUNTER — Encounter: Payer: Commercial Managed Care - PPO | Admitting: Gastroenterology

## 2021-08-11 VITALS — BP 121/67 | HR 67 | Temp 98.0°F | Resp 10 | Ht 74.0 in | Wt 319.0 lb

## 2021-08-11 DIAGNOSIS — K449 Diaphragmatic hernia without obstruction or gangrene: Secondary | ICD-10-CM | POA: Diagnosis not present

## 2021-08-11 DIAGNOSIS — K222 Esophageal obstruction: Secondary | ICD-10-CM

## 2021-08-11 DIAGNOSIS — Z86010 Personal history of colonic polyps: Secondary | ICD-10-CM | POA: Diagnosis present

## 2021-08-11 DIAGNOSIS — R1310 Dysphagia, unspecified: Secondary | ICD-10-CM | POA: Diagnosis not present

## 2021-08-11 DIAGNOSIS — D123 Benign neoplasm of transverse colon: Secondary | ICD-10-CM

## 2021-08-11 DIAGNOSIS — K2100 Gastro-esophageal reflux disease with esophagitis, without bleeding: Secondary | ICD-10-CM | POA: Diagnosis not present

## 2021-08-11 MED ORDER — SODIUM CHLORIDE 0.9 % IV SOLN: 500.0000 mL | Freq: Once | INTRAVENOUS | Status: DC

## 2021-08-11 MED ORDER — PANTOPRAZOLE SODIUM 40 MG PO TBEC: 40.0000 mg | DELAYED_RELEASE_TABLET | Freq: Every day | ORAL | 3 refills | Status: DC

## 2021-08-11 NOTE — Op Note (Signed)
[facility] Patient Name: George Mathews Procedure Date: 08/11/2021 9:25 AM MRN: [id number] Endoscopist: Ladene Artist , MD Age: 65 Referring MD:  Date of Birth: 05-07-1956 Gender: Male Account #: [id number] Procedure:                Colonoscopy Indications:              Surveillance: Personal history of adenomatous                            polyps on last colonoscopy 5 years ago Medicines:                Monitored Anesthesia Care Procedure:                Pre-Anesthesia Assessment:                           - Prior to the procedure, a History and Physical                            was performed, and patient medications and                            allergies were reviewed. The patient's tolerance of                            previous anesthesia was also reviewed. The risks                            and benefits of the procedure and the sedation                            options and risks were discussed with the patient.                            All questions were answered, and informed consent                            was obtained. Prior Anticoagulants: The patient has                            taken no previous anticoagulant or antiplatelet                            agents. ASA Grade Assessment: III - A patient with                            severe systemic disease. After reviewing the risks                            and benefits, the patient was deemed in                            satisfactory condition to undergo the procedure.  After obtaining informed consent, the colonoscope                            was passed under direct vision. Throughout the                            procedure, the patient's blood pressure, pulse, and                            oxygen saturations were monitored continuously. The                            CF [id number] #[id number] was introduced through the anus                            and advanced to the  the cecum, identified by                            appendiceal orifice and ileocecal valve. The                            ileocecal valve, appendiceal orifice, and rectum                            were photographed. The quality of the bowel                            preparation was adequate. The colonoscopy was                            performed without difficulty. The patient tolerated                            the procedure well. Scope In: 9:38:50 AM Scope Out: 9:57:34 AM Scope Withdrawal Time: 0 hours 14 minutes 0 seconds  Total Procedure Duration: 0 hours 18 minutes 44 seconds  Findings:                 The perianal and digital rectal examinations were                            normal.                           A 6 mm polyp was found in the transverse colon. The                            polyp was sessile. The polyp was removed with a                            cold snare. Resection and retrieval were complete.                           Scattered medium-mouthed diverticula were found in  the right colon. There was no evidence of                            diverticular bleeding.                           Many medium-mouthed diverticula were found in the                            left colon. There was narrowing of the colon in                            association with the diverticular opening. There                            was evidence of diverticular spasm.                            Peri-diverticular erythema was seen. There was no                            evidence of diverticular bleeding.                           Internal hemorrhoids were found during                            retroflexion. The hemorrhoids were small and Grade                            I (internal hemorrhoids that do not prolapse).                           The exam was otherwise without abnormality on                            direct and retroflexion  views. Complications:            No immediate complications. Estimated blood loss:                            None. Estimated Blood Loss:     Estimated blood loss: none. Impression:               - One 6 mm polyp in the transverse colon, removed                            with a cold snare. Resected and retrieved.                           - Mild diverticulosis in the right colon.                           - Moderate diverticulosis in the left colon.                           -  Internal hemorrhoids.                           - The examination was otherwise normal on direct                            and retroflexion views. Recommendation:           - Repeat colonoscopy after studies are complete for                            surveillance based on pathology results.                           - Patient has a contact number available for                            emergencies. The signs and symptoms of potential                            delayed complications were discussed with the                            patient. Return to normal activities tomorrow.                            Written discharge instructions were provided to the                            patient.                           - High fiber diet.                           - Continue present medications.                           - Await pathology results. Ladene Artist, MD 08/11/2021 10:01:00 AM This report has been signed electronically.

## 2021-08-11 NOTE — Progress Notes (Signed)
Pt in recovery with monitors in place, VSS. Report given to receiving RN. Bite guard was placed with pt awake to ensure comfort. No dental or soft tissue damage noted. 

## 2021-08-11 NOTE — Progress Notes (Signed)
No problems noted in the recovery room. maw 

## 2021-08-11 NOTE — Patient Instructions (Addendum)
Handouts were given to your care partner on polyps, diverticulosis, a high fiber diet with liberal fluid intake, Hemorrhoids, Esophageal stricture and esophagitis, Follow antireflux measures,and a Hiatal Hernia. You may resume your current medications today. A prescription for PANTOPRAZOLE 40 mg take one daily.  Best to take 10-20 minutes before breakfast on an empty stomach. Await biopsy results.  May take 1-3 weeks to receive pathology results. Call to schedule a follow up to see Dr. Fuller Plan in the office for 1 year. Please call if any questions or concerns.       YOU HAD AN ENDOSCOPIC PROCEDURE TODAY AT [facility]:   Refer to the procedure report that was given to you for any specific questions about what was found during the examination.  If the procedure report does not answer your questions, please call your gastroenterologist to clarify.  If you requested that your care partner not be given the details of your procedure findings, then the procedure report has been included in a sealed envelope for you to review at your convenience later.  YOU SHOULD EXPECT: Some feelings of bloating in the abdomen. Passage of more gas than usual.  Walking can help get rid of the air that was put into your GI tract during the procedure and reduce the bloating. If you had a lower endoscopy (such as a colonoscopy or flexible sigmoidoscopy) you may notice spotting of blood in your stool or on the toilet paper. If you underwent a bowel prep for your procedure, you may not have a normal bowel movement for a few days.  Please Note:  You might notice some irritation and congestion in your nose or some drainage.  This is from the oxygen used during your procedure.  There is no need for concern and it should clear up in a day or so.  SYMPTOMS TO REPORT IMMEDIATELY:  Following lower endoscopy (colonoscopy or flexible sigmoidoscopy):  Excessive amounts of blood in the stool  Significant tenderness or  worsening of abdominal pains  Swelling of the abdomen that is new, acute  Fever of 100F or higher  Following upper endoscopy (EGD)  Vomiting of blood or coffee ground material  New chest pain or pain under the shoulder blades  Painful or persistently difficult swallowing  New shortness of breath  Fever of 100F or higher  Black, tarry-looking stools  For urgent or emergent issues, a gastroenterologist can be reached at any hour by calling [phone]. Do not use MyChart messaging for urgent concerns.    DIET:  Please follow the esophageal dilatation diet the rest of today.  You may resume your prior diet tomorrow morning.  Drink plenty of fluids but you should avoid alcoholic beverages for 24 hours.  ACTIVITY:  You should plan to take it easy for the rest of today and you should NOT DRIVE or use heavy machinery until tomorrow (because of the sedation medicines used during the test).    FOLLOW UP: Our staff will call the number listed on your records 48-72 hours following your procedure to check on you and address any questions or concerns that you may have regarding the information given to you following your procedure. If we do not reach you, we will leave a message.  We will attempt to reach you two times.  During this call, we will ask if you have developed any symptoms of COVID 19. If you develop any symptoms (ie: fever, flu-like symptoms, shortness of breath, cough etc.) before then, please  call [phone].  If you test positive for Covid 19 in the 2 weeks post procedure, please call and report this information to [place].    If any biopsies were taken you will be contacted by phone or by letter within the next 1-3 weeks.  Please call us at [phone] if you have not heard about the biopsies in 3 weeks.    SIGNATURES/CONFIDENTIALITY: You and/or your care partner have signed paperwork which will be entered into your electronic medical record.  These signatures attest to the fact  that that the information above on your After Visit Summary has been reviewed and is understood.  Full responsibility of the confidentiality of this discharge information lies with you and/or your care-partner.

## 2021-08-11 NOTE — Progress Notes (Signed)
Called to room to assist during endoscopic procedure.  Patient ID and intended procedure confirmed with present staff. Received instructions for my participation in the procedure from the performing physician.  

## 2021-08-11 NOTE — Progress Notes (Signed)
Check-in-AM  V/S-CW

## 2021-08-11 NOTE — Op Note (Signed)
[facility] Patient Name: George Mathews Procedure Date: 08/11/2021 9:24 AM MRN: [id number] Endoscopist: Ladene Artist , MD Age: 65 Referring MD:  Date of Birth: [birth year] Gender: Male Account #: [id number] Procedure:                Upper GI endoscopy Indications:              Dysphagia Medicines:                Monitored Anesthesia Care Procedure:                Pre-Anesthesia Assessment:                           - Prior to the procedure, a History and Physical                            was performed, and patient medications and                            allergies were reviewed. The patient's tolerance of                            previous anesthesia was also reviewed. The risks                            and benefits of the procedure and the sedation                            options and risks were discussed with the patient.                            All questions were answered, and informed consent                            was obtained. Prior Anticoagulants: The patient has                            taken no previous anticoagulant or antiplatelet                            agents. ASA Grade Assessment: III - A patient with                            severe systemic disease. After reviewing the risks                            and benefits, the patient was deemed in                            satisfactory condition to undergo the procedure.                           After obtaining informed consent, the endoscope was  passed under direct vision. Throughout the                            procedure, the patient's blood pressure, pulse, and                            oxygen saturations were monitored continuously. The                            Endoscope was introduced through the mouth, and                            advanced to the second part of duodenum. The upper                            GI endoscopy was accomplished without  difficulty.                            The patient tolerated the procedure well. Scope In: Scope Out: Findings:                 LA Grade C (one or more mucosal breaks continuous                            between tops of 2 or more mucosal folds, less than                            75% circumference) esophagitis with no bleeding was                            found in the distal esophagus.                           One benign-appearing, intrinsic mild stenosis was                            found at the gastroesophageal junction. This                            stenosis measured 1.4 cm (inner diameter) x less                            than one cm (in length). The stenosis was                            traversed. A guidewire was placed and the scope was                            withdrawn. Dilations were performed with Savary                            dilators with mild resistance at 16 mm and 17 mm.  No heme.                           The exam of the esophagus was otherwise normal.                           A small hiatal hernia was present.                           The exam of the stomach was otherwise normal.                           The duodenal bulb and second portion of the                            duodenum were normal. Complications:            No immediate complications. Estimated Blood Loss:     Estimated blood loss was minimal. Impression:               - LA Grade C reflux esophagitis with no bleeding.                           - Benign-appearing esophageal stenosis. Dilated.                           - Small hiatal hernia.                           - Normal duodenal bulb and second portion of the                            duodenum.                           - No specimens collected. Recommendation:           - Patient has a contact number available for                            emergencies. The signs and symptoms of potential                             delayed complications were discussed with the                            patient. Return to normal activities tomorrow.                            Written discharge instructions were provided to the                            patient.                           - Resume previous diet.                           -  Follow antireflux measures long term.                           - Continue present medications.                           - Protonix (pantoprazole) 40 mg PO daily, 1 year of                            refills.                           - GI office appt in 1 year. Ladene Artist, MD 08/11/2021 10:15:41 AM This report has been signed electronically.

## 2021-08-11 NOTE — Progress Notes (Signed)
History & Physical  Primary Care Physician:  Mayra Neer, MD Primary Gastroenterologist: Lucio Edward, MD  CHIEF COMPLAINT: Dysphagia, personal history of colon polyps   HPI: George Mathews is a 65 y.o. male with intermittent solid food dysphagia and a personal history of adenomatous colon polyps for colonoscopy and EGD.  See 06/24/2021 office note.   Past Medical History:  Diagnosis Date   Allergy    Anxiety    Cancer ([place])    prostate   Crohn's disease ([place])    Diverticulosis    GERD (gastroesophageal reflux disease)    Hyperlipidemia    Hypertension    Hypothyroidism    Obesity    Osteoarthritis    Prostate cancer ([place])    Psoriasis of scalp    Tubular adenoma of colon    2010    Past Surgical History:  Procedure Laterality Date   ANAL FISTULECTOMY     COLONOSCOPY     POLYPECTOMY     PROSTATE CRYOABLATION  2014   TONSILLECTOMY     in 20's   WISDOM TOOTH EXTRACTION      Prior to Admission medications   Medication Sig Start Date End Date Taking? Authorizing Provider  amLODipine (NORVASC) 5 MG tablet Take 5 mg by mouth daily.   Yes [provider]  aspirin EC 81 MG tablet Take 81 mg by mouth daily.    Yes [provider]  atorvastatin (LIPITOR) 10 MG tablet Take 10 mg by mouth daily.   Yes [provider]  buPROPion (WELLBUTRIN XL) 300 MG 24 hr tablet Take 300 mg by mouth daily.   Yes [provider]  cetirizine (ZYRTEC) 10 MG tablet Take 10 mg by mouth daily.    Yes [provider]  Fish Oil-Cholecalciferol (OMEGA-3 FISH OIL/VITAMIN D3) 1000-1000 MG-UNIT CAPS Take 1 capsule by mouth daily.    Yes [provider]  levothyroxine (SYNTHROID, LEVOTHROID) 125 MCG tablet Take 120 mcg by mouth daily before breakfast.  12/27/13  Yes [provider]  lisinopril (PRINIVIL,ZESTRIL) 10 MG tablet Take 10 mg by mouth daily.    Yes [provider]  Misc Natural Products (PROSTATE HEALTH PO) Take 2  tablets by mouth daily.    Yes [provider]  mometasone (ELOCON) 0.1 % cream Apply 1 application topically daily. Reported on 11/21/2015 02/09/14   [provider]    Current Outpatient Medications  Medication Sig Dispense Refill   amLODipine (NORVASC) 5 MG tablet Take 5 mg by mouth daily.     aspirin EC 81 MG tablet Take 81 mg by mouth daily.      atorvastatin (LIPITOR) 10 MG tablet Take 10 mg by mouth daily.     buPROPion (WELLBUTRIN XL) 300 MG 24 hr tablet Take 300 mg by mouth daily.     cetirizine (ZYRTEC) 10 MG tablet Take 10 mg by mouth daily.      Fish Oil-Cholecalciferol (OMEGA-3 FISH OIL/VITAMIN D3) 1000-1000 MG-UNIT CAPS Take 1 capsule by mouth daily.      levothyroxine (SYNTHROID, LEVOTHROID) 125 MCG tablet Take 120 mcg by mouth daily before breakfast.      lisinopril (PRINIVIL,ZESTRIL) 10 MG tablet Take 10 mg by mouth daily.      Misc Natural Products (PROSTATE HEALTH PO) Take 2 tablets by mouth daily.      mometasone (ELOCON) 0.1 % cream Apply 1 application topically daily. Reported on 11/21/2015     Current Facility-Administered Medications  Medication Dose Route Frequency Provider Last  Rate Last Admin   0.9 %  sodium chloride infusion  500 mL Intravenous Once Ladene Artist, MD        Allergies as of 08/11/2021   (No Known Allergies)    Family History  Problem Relation Age of Onset   Colon polyps Mother    Colon cancer Neg Hx    Esophageal cancer Neg Hx    Rectal cancer Neg Hx    Stomach cancer Neg Hx     Social History   Socioeconomic History   Marital status: Married    Spouse name: Not on file   Number of children: Not on file   Years of education: Not on file   Highest education level: Not on file  Occupational History   Not on file  Tobacco Use   Smoking status: Former   Smokeless tobacco: Never  Substance and Sexual Activity   Alcohol use: Yes    Alcohol/week: 0.0 standard drinks    Comment: occasionally   Drug use: No    Sexual activity: Not on file  Other Topics Concern   Not on file  Social History Narrative   Not on file   Social Determinants of Health   Financial Resource Strain: Not on file  Food Insecurity: Not on file  Transportation Needs: Not on file  Physical Activity: Not on file  Stress: Not on file  Social Connections: Not on file  Intimate Partner Violence: Not on file    Review of Systems:  All systems reviewed an negative except where noted in HPI.  Gen: Denies any fever, chills, sweats, anorexia, fatigue, weakness, malaise, weight loss, and sleep disorder CV: Denies chest pain, angina, palpitations, syncope, orthopnea, PND, peripheral edema, and claudication. Resp: Denies dyspnea at rest, dyspnea with exercise, cough, sputum, wheezing, coughing up blood, and pleurisy. GI: Denies vomiting blood, jaundice, and fecal incontinence.   Denies dysphagia or odynophagia. GU : Denies urinary burning, blood in urine, urinary frequency, urinary hesitancy, nocturnal urination, and urinary incontinence. MS: Denies joint pain, limitation of movement, and swelling, stiffness, low back pain, extremity pain. Denies muscle weakness, cramps, atrophy.  Derm: Denies rash, itching, dry skin, hives, moles, warts, or unhealing ulcers.  Psych: Denies depression, anxiety, memory loss, suicidal ideation, hallucinations, paranoia, and confusion. Heme: Denies bruising, bleeding, and enlarged lymph nodes. Neuro:  Denies any headaches, dizziness, paresthesias. Endo:  Denies any problems with DM, thyroid, adrenal function.   Physical Exam: General:  Alert, well-developed, in NAD Head:  Normocephalic and atraumatic. Eyes:  Sclera clear, no icterus.   Conjunctiva pink. Ears:  Normal auditory acuity. Mouth:  No deformity or lesions.  Neck:  Supple; no masses . Lungs:  Clear throughout to auscultation.   No wheezes, crackles, or rhonchi. No acute distress. Heart:  Regular rate and rhythm; no murmurs. Abdomen:   Soft, nondistended, nontender. No masses, hepatomegaly. No obvious masses.  Normal bowel .    Rectal:  Deferred   Msk:  Symmetrical without gross deformities.. Pulses:  Normal pulses noted. Extremities:  Without edema. Neurologic:  Alert and  oriented x4;  grossly normal neurologically. Skin:  Intact without significant lesions or rashes. Cervical Nodes:  No significant cervical adenopathy. Psych:  Alert and cooperative. Normal mood and affect.   Impression / Plan:   Personal history of adenomatous colon polyps and intermittent solid food dysphagia for colonoscopy and EGD with possible dilation.   This patient is appropriate for endoscopic procedures in the ambulatory setting.    Pricilla Riffle. Fuller Plan  08/11/2021, 9:35 AM See Shea Evans, [facility], to contact our on call provider

## 2021-08-13 ENCOUNTER — Encounter: Payer: 59 | Admitting: Gastroenterology

## 2021-08-13 ENCOUNTER — Telehealth: Payer: Self-pay

## 2021-08-13 NOTE — Telephone Encounter (Signed)
No answer, left message to call back later today, B.Paisley Grajeda RN. 

## 2021-08-13 NOTE — Telephone Encounter (Signed)
  Follow up Call-  Call back number 08/11/2021  Post procedure Call Back phone  # [phone]  Permission to leave phone message Yes  Some recent data might be hidden     Patient questions:  Do you have a fever, pain , or abdominal swelling? No. Pain Score  0 *  Have you tolerated food without any problems? Yes.    Have you been able to return to your normal activities? Yes.    Do you have any questions about your discharge instructions: Diet   No. Medications  No. Follow up visit  No.  Do you have questions or concerns about your Care? No.  Actions: * If pain score is 4 or above: No action needed, pain <4.  Have you developed a fever since your procedure? no  2.   Have you had an respiratory symptoms (SOB or cough) since your procedure? no  3.   Have you tested positive for COVID 19 since your procedure no  4.   Have you had any family members/close contacts diagnosed with the COVID 19 since your procedure?  no   If yes to any of these questions please route to Joylene John, RN and Joella Prince, RN

## 2021-08-26 ENCOUNTER — Encounter: Payer: Self-pay | Admitting: Gastroenterology

## 2022-08-08 ENCOUNTER — Other Ambulatory Visit: Payer: Self-pay | Admitting: Gastroenterology

## 2022-08-08 DIAGNOSIS — K222 Esophageal obstruction: Secondary | ICD-10-CM

## 2022-08-08 DIAGNOSIS — R1310 Dysphagia, unspecified: Secondary | ICD-10-CM

## 2022-11-20 ENCOUNTER — Other Ambulatory Visit: Payer: Self-pay | Admitting: Gastroenterology

## 2022-11-20 DIAGNOSIS — K222 Esophageal obstruction: Secondary | ICD-10-CM

## 2022-11-20 DIAGNOSIS — R1310 Dysphagia, unspecified: Secondary | ICD-10-CM

## 2022-12-31 ENCOUNTER — Other Ambulatory Visit: Payer: Self-pay | Admitting: Gastroenterology

## 2022-12-31 DIAGNOSIS — R1310 Dysphagia, unspecified: Secondary | ICD-10-CM

## 2022-12-31 DIAGNOSIS — K222 Esophageal obstruction: Secondary | ICD-10-CM

## 2023-03-31 ENCOUNTER — Other Ambulatory Visit: Payer: Self-pay | Admitting: Gastroenterology

## 2023-03-31 DIAGNOSIS — R1310 Dysphagia, unspecified: Secondary | ICD-10-CM

## 2023-03-31 DIAGNOSIS — K222 Esophageal obstruction: Secondary | ICD-10-CM

## 2023-06-11 ENCOUNTER — Other Ambulatory Visit: Payer: Self-pay | Admitting: Gastroenterology

## 2023-06-11 DIAGNOSIS — K222 Esophageal obstruction: Secondary | ICD-10-CM

## 2023-06-11 DIAGNOSIS — R1310 Dysphagia, unspecified: Secondary | ICD-10-CM

## 2023-08-30 DIAGNOSIS — E039 Hypothyroidism, unspecified: Secondary | ICD-10-CM | POA: Diagnosis not present

## 2023-09-28 DIAGNOSIS — Z8546 Personal history of malignant neoplasm of prostate: Secondary | ICD-10-CM | POA: Diagnosis not present

## 2023-09-29 DIAGNOSIS — H43822 Vitreomacular adhesion, left eye: Secondary | ICD-10-CM | POA: Diagnosis not present

## 2023-09-29 DIAGNOSIS — H59811 Chorioretinal scars after surgery for detachment, right eye: Secondary | ICD-10-CM | POA: Diagnosis not present

## 2023-09-29 DIAGNOSIS — Z9889 Other specified postprocedural states: Secondary | ICD-10-CM | POA: Diagnosis not present

## 2023-10-12 DIAGNOSIS — E65 Localized adiposity: Secondary | ICD-10-CM | POA: Diagnosis not present

## 2023-10-12 DIAGNOSIS — I10 Essential (primary) hypertension: Secondary | ICD-10-CM | POA: Diagnosis not present

## 2023-10-12 DIAGNOSIS — E7800 Pure hypercholesterolemia, unspecified: Secondary | ICD-10-CM | POA: Diagnosis not present

## 2023-10-12 DIAGNOSIS — Z6836 Body mass index (BMI) 36.0-36.9, adult: Secondary | ICD-10-CM | POA: Diagnosis not present

## 2023-10-12 DIAGNOSIS — R7301 Impaired fasting glucose: Secondary | ICD-10-CM | POA: Diagnosis not present

## 2023-11-02 DIAGNOSIS — R632 Polyphagia: Secondary | ICD-10-CM | POA: Diagnosis not present

## 2023-11-02 DIAGNOSIS — E669 Obesity, unspecified: Secondary | ICD-10-CM | POA: Diagnosis not present

## 2023-11-02 DIAGNOSIS — E7800 Pure hypercholesterolemia, unspecified: Secondary | ICD-10-CM | POA: Diagnosis not present

## 2023-11-02 DIAGNOSIS — I10 Essential (primary) hypertension: Secondary | ICD-10-CM | POA: Diagnosis not present

## 2023-11-02 DIAGNOSIS — R7301 Impaired fasting glucose: Secondary | ICD-10-CM | POA: Diagnosis not present

## 2023-11-19 DIAGNOSIS — E7800 Pure hypercholesterolemia, unspecified: Secondary | ICD-10-CM | POA: Diagnosis not present

## 2023-11-19 DIAGNOSIS — E039 Hypothyroidism, unspecified: Secondary | ICD-10-CM | POA: Diagnosis not present

## 2023-11-19 DIAGNOSIS — R7301 Impaired fasting glucose: Secondary | ICD-10-CM | POA: Diagnosis not present

## 2023-11-19 DIAGNOSIS — I10 Essential (primary) hypertension: Secondary | ICD-10-CM | POA: Diagnosis not present

## 2023-12-08 ENCOUNTER — Other Ambulatory Visit: Payer: Self-pay | Admitting: Medical Genetics

## 2023-12-13 ENCOUNTER — Other Ambulatory Visit (HOSPITAL_COMMUNITY)
Admission: RE | Admit: 2023-12-13 | Discharge: 2023-12-13 | Disposition: A | Discharge status: Home / Self Care | Payer: Self-pay | Source: Ambulatory Visit | Attending: Oncology | Admitting: Oncology

## 2023-12-14 DIAGNOSIS — R0683 Snoring: Secondary | ICD-10-CM | POA: Diagnosis not present

## 2023-12-14 DIAGNOSIS — R632 Polyphagia: Secondary | ICD-10-CM | POA: Diagnosis not present

## 2023-12-14 DIAGNOSIS — R7301 Impaired fasting glucose: Secondary | ICD-10-CM | POA: Diagnosis not present

## 2023-12-16 ENCOUNTER — Other Ambulatory Visit (HOSPITAL_COMMUNITY): Payer: Self-pay

## 2023-12-22 LAB — GENECONNECT MOLECULAR SCREEN: Genetic Analysis Overall Interpretation: NEGATIVE

## 2023-12-29 DIAGNOSIS — R0683 Snoring: Secondary | ICD-10-CM | POA: Diagnosis not present

## 2024-01-07 DIAGNOSIS — H2513 Age-related nuclear cataract, bilateral: Secondary | ICD-10-CM | POA: Diagnosis not present

## 2024-01-07 DIAGNOSIS — H18413 Arcus senilis, bilateral: Secondary | ICD-10-CM | POA: Diagnosis not present

## 2024-01-07 DIAGNOSIS — H25043 Posterior subcapsular polar age-related cataract, bilateral: Secondary | ICD-10-CM | POA: Diagnosis not present

## 2024-01-07 DIAGNOSIS — H25013 Cortical age-related cataract, bilateral: Secondary | ICD-10-CM | POA: Diagnosis not present

## 2024-01-07 DIAGNOSIS — H2511 Age-related nuclear cataract, right eye: Secondary | ICD-10-CM | POA: Diagnosis not present

## 2024-01-17 DIAGNOSIS — H2511 Age-related nuclear cataract, right eye: Secondary | ICD-10-CM | POA: Diagnosis not present

## 2024-01-18 DIAGNOSIS — E66812 Obesity, class 2: Secondary | ICD-10-CM | POA: Diagnosis not present

## 2024-01-18 DIAGNOSIS — I10 Essential (primary) hypertension: Secondary | ICD-10-CM | POA: Diagnosis not present

## 2024-01-18 DIAGNOSIS — E7800 Pure hypercholesterolemia, unspecified: Secondary | ICD-10-CM | POA: Diagnosis not present

## 2024-01-18 DIAGNOSIS — F411 Generalized anxiety disorder: Secondary | ICD-10-CM | POA: Diagnosis not present

## 2024-01-18 DIAGNOSIS — Z6836 Body mass index (BMI) 36.0-36.9, adult: Secondary | ICD-10-CM | POA: Diagnosis not present

## 2024-01-25 DIAGNOSIS — G4710 Hypersomnia, unspecified: Secondary | ICD-10-CM | POA: Diagnosis not present

## 2024-01-27 DIAGNOSIS — G4719 Other hypersomnia: Secondary | ICD-10-CM | POA: Diagnosis not present

## 2024-01-27 DIAGNOSIS — R0683 Snoring: Secondary | ICD-10-CM | POA: Diagnosis not present

## 2024-03-06 DIAGNOSIS — R0683 Snoring: Secondary | ICD-10-CM | POA: Diagnosis not present

## 2024-03-06 DIAGNOSIS — R0681 Apnea, not elsewhere classified: Secondary | ICD-10-CM | POA: Diagnosis not present

## 2024-03-07 DIAGNOSIS — E039 Hypothyroidism, unspecified: Secondary | ICD-10-CM | POA: Diagnosis not present

## 2024-03-07 DIAGNOSIS — E7800 Pure hypercholesterolemia, unspecified: Secondary | ICD-10-CM | POA: Diagnosis not present

## 2024-03-07 DIAGNOSIS — R632 Polyphagia: Secondary | ICD-10-CM | POA: Diagnosis not present

## 2024-03-07 DIAGNOSIS — G4733 Obstructive sleep apnea (adult) (pediatric): Secondary | ICD-10-CM | POA: Diagnosis not present

## 2024-03-07 DIAGNOSIS — Z6836 Body mass index (BMI) 36.0-36.9, adult: Secondary | ICD-10-CM | POA: Diagnosis not present

## 2024-03-29 DIAGNOSIS — H43822 Vitreomacular adhesion, left eye: Secondary | ICD-10-CM | POA: Diagnosis not present

## 2024-03-29 DIAGNOSIS — H31091 Other chorioretinal scars, right eye: Secondary | ICD-10-CM | POA: Diagnosis not present

## 2024-03-29 DIAGNOSIS — H2511 Age-related nuclear cataract, right eye: Secondary | ICD-10-CM | POA: Diagnosis not present

## 2024-03-29 DIAGNOSIS — Z961 Presence of intraocular lens: Secondary | ICD-10-CM | POA: Diagnosis not present

## 2024-05-08 DIAGNOSIS — R632 Polyphagia: Secondary | ICD-10-CM | POA: Diagnosis not present

## 2024-05-08 DIAGNOSIS — I10 Essential (primary) hypertension: Secondary | ICD-10-CM | POA: Diagnosis not present

## 2024-05-08 DIAGNOSIS — E7800 Pure hypercholesterolemia, unspecified: Secondary | ICD-10-CM | POA: Diagnosis not present

## 2024-05-30 DIAGNOSIS — Z1159 Encounter for screening for other viral diseases: Secondary | ICD-10-CM | POA: Diagnosis not present

## 2024-05-30 DIAGNOSIS — R7301 Impaired fasting glucose: Secondary | ICD-10-CM | POA: Diagnosis not present

## 2024-05-30 DIAGNOSIS — I10 Essential (primary) hypertension: Secondary | ICD-10-CM | POA: Diagnosis not present

## 2024-05-30 DIAGNOSIS — Z0000 Encounter for general adult medical examination without abnormal findings: Secondary | ICD-10-CM | POA: Diagnosis not present

## 2024-05-30 DIAGNOSIS — C61 Malignant neoplasm of prostate: Secondary | ICD-10-CM | POA: Diagnosis not present

## 2024-05-30 DIAGNOSIS — Z1331 Encounter for screening for depression: Secondary | ICD-10-CM | POA: Diagnosis not present

## 2024-05-30 DIAGNOSIS — Z23 Encounter for immunization: Secondary | ICD-10-CM | POA: Diagnosis not present

## 2024-05-30 DIAGNOSIS — E039 Hypothyroidism, unspecified: Secondary | ICD-10-CM | POA: Diagnosis not present

## 2024-05-30 DIAGNOSIS — E7800 Pure hypercholesterolemia, unspecified: Secondary | ICD-10-CM | POA: Diagnosis not present

## 2024-06-06 DIAGNOSIS — K219 Gastro-esophageal reflux disease without esophagitis: Secondary | ICD-10-CM | POA: Diagnosis not present

## 2024-06-06 DIAGNOSIS — E7800 Pure hypercholesterolemia, unspecified: Secondary | ICD-10-CM | POA: Diagnosis not present

## 2024-06-06 DIAGNOSIS — I10 Essential (primary) hypertension: Secondary | ICD-10-CM | POA: Diagnosis not present

## 2024-06-06 DIAGNOSIS — E66812 Obesity, class 2: Secondary | ICD-10-CM | POA: Diagnosis not present

## 2024-06-06 DIAGNOSIS — Z6835 Body mass index (BMI) 35.0-35.9, adult: Secondary | ICD-10-CM | POA: Diagnosis not present

## 2024-06-23 DIAGNOSIS — Z87891 Personal history of nicotine dependence: Secondary | ICD-10-CM | POA: Diagnosis not present

## 2024-06-23 DIAGNOSIS — Z136 Encounter for screening for cardiovascular disorders: Secondary | ICD-10-CM | POA: Diagnosis not present

## 2024-07-04 DIAGNOSIS — Z6834 Body mass index (BMI) 34.0-34.9, adult: Secondary | ICD-10-CM | POA: Diagnosis not present

## 2024-07-04 DIAGNOSIS — E7800 Pure hypercholesterolemia, unspecified: Secondary | ICD-10-CM | POA: Diagnosis not present

## 2024-07-04 DIAGNOSIS — K219 Gastro-esophageal reflux disease without esophagitis: Secondary | ICD-10-CM | POA: Diagnosis not present

## 2024-07-04 DIAGNOSIS — I10 Essential (primary) hypertension: Secondary | ICD-10-CM | POA: Diagnosis not present

## 2024-07-04 DIAGNOSIS — E6609 Other obesity due to excess calories: Secondary | ICD-10-CM | POA: Diagnosis not present

## 2024-08-09 DIAGNOSIS — E66811 Obesity, class 1: Secondary | ICD-10-CM | POA: Diagnosis not present

## 2024-08-09 DIAGNOSIS — E65 Localized adiposity: Secondary | ICD-10-CM | POA: Diagnosis not present

## 2024-08-09 DIAGNOSIS — Z6834 Body mass index (BMI) 34.0-34.9, adult: Secondary | ICD-10-CM | POA: Diagnosis not present

## 2024-08-09 DIAGNOSIS — E7800 Pure hypercholesterolemia, unspecified: Secondary | ICD-10-CM | POA: Diagnosis not present

## 2024-08-09 DIAGNOSIS — I10 Essential (primary) hypertension: Secondary | ICD-10-CM | POA: Diagnosis not present

## 2024-08-09 DIAGNOSIS — K219 Gastro-esophageal reflux disease without esophagitis: Secondary | ICD-10-CM | POA: Diagnosis not present

## 2024-10-05 DIAGNOSIS — I10 Essential (primary) hypertension: Secondary | ICD-10-CM | POA: Diagnosis not present

## 2024-10-05 DIAGNOSIS — K219 Gastro-esophageal reflux disease without esophagitis: Secondary | ICD-10-CM | POA: Diagnosis not present

## 2024-10-05 DIAGNOSIS — E65 Localized adiposity: Secondary | ICD-10-CM | POA: Diagnosis not present

## 2024-10-05 DIAGNOSIS — E7800 Pure hypercholesterolemia, unspecified: Secondary | ICD-10-CM | POA: Diagnosis not present
